# Patient Record
Sex: Male | Born: 1950 | Race: White | Hispanic: No | Marital: Married | Smoking: Former smoker
Health system: Southern US, Community
[De-identification: ages and names within clinical notes are randomized; demographics above are authoritative.]

## PROBLEM LIST (undated history)

## (undated) DIAGNOSIS — E7800 Pure hypercholesterolemia, unspecified: Secondary | ICD-10-CM

## (undated) HISTORY — PX: JOINT REPLACEMENT: SHX530

---

## 2016-03-20 ENCOUNTER — Emergency Department (HOSPITAL_COMMUNITY): Payer: Federal, State, Local not specified - PPO

## 2016-03-20 ENCOUNTER — Inpatient Hospital Stay (HOSPITAL_COMMUNITY): Payer: Federal, State, Local not specified - PPO

## 2016-03-20 ENCOUNTER — Inpatient Hospital Stay (HOSPITAL_COMMUNITY)
Admission: EM | Admit: 2016-03-20 | Discharge: 2016-03-23 | DRG: 024 | Disposition: A | Discharge status: Home / Self Care | Payer: Federal, State, Local not specified - PPO | Attending: Neurology | Admitting: Neurology

## 2016-03-20 ENCOUNTER — Inpatient Hospital Stay (HOSPITAL_COMMUNITY): Payer: Federal, State, Local not specified - PPO | Admitting: Certified Registered Nurse Anesthetist

## 2016-03-20 ENCOUNTER — Encounter (HOSPITAL_COMMUNITY): Payer: Self-pay | Admitting: Anesthesiology

## 2016-03-20 ENCOUNTER — Encounter (HOSPITAL_COMMUNITY)
Admission: EM | Disposition: A | Discharge status: Home / Self Care | Payer: Self-pay | Source: Home / Self Care | Attending: Neurology

## 2016-03-20 ENCOUNTER — Encounter (HOSPITAL_COMMUNITY): Payer: Self-pay | Admitting: Radiology

## 2016-03-20 DIAGNOSIS — Z87891 Personal history of nicotine dependence: Secondary | ICD-10-CM

## 2016-03-20 DIAGNOSIS — I63512 Cerebral infarction due to unspecified occlusion or stenosis of left middle cerebral artery: Secondary | ICD-10-CM | POA: Diagnosis present

## 2016-03-20 DIAGNOSIS — Q251 Coarctation of aorta: Secondary | ICD-10-CM

## 2016-03-20 DIAGNOSIS — I82542 Chronic embolism and thrombosis of left tibial vein: Secondary | ICD-10-CM | POA: Diagnosis present

## 2016-03-20 DIAGNOSIS — G8191 Hemiplegia, unspecified affecting right dominant side: Secondary | ICD-10-CM | POA: Diagnosis not present

## 2016-03-20 DIAGNOSIS — I82532 Chronic embolism and thrombosis of left popliteal vein: Secondary | ICD-10-CM | POA: Diagnosis present

## 2016-03-20 DIAGNOSIS — W19XXXA Unspecified fall, initial encounter: Secondary | ICD-10-CM

## 2016-03-20 DIAGNOSIS — R4702 Dysphasia: Secondary | ICD-10-CM

## 2016-03-20 DIAGNOSIS — I959 Hypotension, unspecified: Secondary | ICD-10-CM | POA: Diagnosis present

## 2016-03-20 DIAGNOSIS — Z9289 Personal history of other medical treatment: Secondary | ICD-10-CM

## 2016-03-20 DIAGNOSIS — E7800 Pure hypercholesterolemia, unspecified: Secondary | ICD-10-CM | POA: Diagnosis present

## 2016-03-20 DIAGNOSIS — Z79899 Other long term (current) drug therapy: Secondary | ICD-10-CM | POA: Diagnosis not present

## 2016-03-20 DIAGNOSIS — I63412 Cerebral infarction due to embolism of left middle cerebral artery: Secondary | ICD-10-CM | POA: Diagnosis not present

## 2016-03-20 DIAGNOSIS — Z96641 Presence of right artificial hip joint: Secondary | ICD-10-CM | POA: Diagnosis present

## 2016-03-20 DIAGNOSIS — R4701 Aphasia: Secondary | ICD-10-CM | POA: Diagnosis present

## 2016-03-20 DIAGNOSIS — I6789 Other cerebrovascular disease: Secondary | ICD-10-CM | POA: Diagnosis not present

## 2016-03-20 DIAGNOSIS — I82512 Chronic embolism and thrombosis of left femoral vein: Secondary | ICD-10-CM | POA: Diagnosis present

## 2016-03-20 DIAGNOSIS — I509 Heart failure, unspecified: Secondary | ICD-10-CM

## 2016-03-20 DIAGNOSIS — E876 Hypokalemia: Secondary | ICD-10-CM | POA: Diagnosis not present

## 2016-03-20 DIAGNOSIS — I639 Cerebral infarction, unspecified: Secondary | ICD-10-CM | POA: Diagnosis not present

## 2016-03-20 DIAGNOSIS — R269 Unspecified abnormalities of gait and mobility: Secondary | ICD-10-CM

## 2016-03-20 DIAGNOSIS — R29705 NIHSS score 5: Secondary | ICD-10-CM | POA: Diagnosis present

## 2016-03-20 DIAGNOSIS — Z01818 Encounter for other preprocedural examination: Secondary | ICD-10-CM

## 2016-03-20 HISTORY — PX: IR GENERIC HISTORICAL: IMG1180011

## 2016-03-20 HISTORY — DX: Pure hypercholesterolemia, unspecified: E78.00

## 2016-03-20 HISTORY — PX: RADIOLOGY WITH ANESTHESIA: SHX6223

## 2016-03-20 LAB — I-STAT CHEM 8, ED
BUN: 24 mg/dL — ABNORMAL HIGH (ref 6–20)
CHLORIDE: 104 mmol/L (ref 101–111)
Calcium, Ion: 1.1 mmol/L — ABNORMAL LOW (ref 1.15–1.40)
Creatinine, Ser: 0.9 mg/dL (ref 0.61–1.24)
Glucose, Bld: 101 mg/dL — ABNORMAL HIGH (ref 65–99)
HCT: 42 % (ref 39.0–52.0)
Hemoglobin: 14.3 g/dL (ref 13.0–17.0)
POTASSIUM: 3.9 mmol/L (ref 3.5–5.1)
SODIUM: 139 mmol/L (ref 135–145)
TCO2: 23 mmol/L (ref 0–100)

## 2016-03-20 LAB — COMPREHENSIVE METABOLIC PANEL
ALBUMIN: 4.1 g/dL (ref 3.5–5.0)
ALK PHOS: 63 U/L (ref 38–126)
ALT: 21 U/L (ref 17–63)
AST: 27 U/L (ref 15–41)
Anion gap: 10 (ref 5–15)
BUN: 19 mg/dL (ref 6–20)
CALCIUM: 9.2 mg/dL (ref 8.9–10.3)
CO2: 23 mmol/L (ref 22–32)
CREATININE: 0.98 mg/dL (ref 0.61–1.24)
Chloride: 104 mmol/L (ref 101–111)
GFR calc Af Amer: >60 mL/min (ref >60)
GFR calc non Af Amer: >60 mL/min (ref >60)
GLUCOSE: 104 mg/dL — AB (ref 65–99)
Potassium: 3.9 mmol/L (ref 3.5–5.1)
SODIUM: 137 mmol/L (ref 135–145)
Total Bilirubin: 0.4 mg/dL (ref 0.3–1.2)
Total Protein: 6.5 g/dL (ref 6.5–8.1)

## 2016-03-20 LAB — RAPID URINE DRUG SCREEN, HOSP PERFORMED
AMPHETAMINES: NOT DETECTED
Barbiturates: NOT DETECTED
Benzodiazepines: NOT DETECTED
COCAINE: NOT DETECTED
OPIATES: NOT DETECTED
TETRAHYDROCANNABINOL: NOT DETECTED

## 2016-03-20 LAB — APTT: aPTT: 27 seconds (ref 24–36)

## 2016-03-20 LAB — BLOOD GAS, ARTERIAL
ACID-BASE DEFICIT: 1.8 mmol/L (ref 0.0–2.0)
BICARBONATE: 22.3 mmol/L (ref 20.0–28.0)
Drawn by: 44898
FIO2: 60
LHR: 16 {breaths}/min
MECHVT: 580 mL
O2 Saturation: 99.6 %
PEEP/CPAP: 5 cmH2O
PO2 ART: 224 mmHg — AB (ref 83.0–108.0)
Patient temperature: 98.6
pCO2 arterial: 37.1 mmHg (ref 32.0–48.0)
pH, Arterial: 7.396 (ref 7.350–7.450)

## 2016-03-20 LAB — DIFFERENTIAL
Basophils Absolute: 0 10*3/uL (ref 0.0–0.1)
Basophils Relative: 0 %
Eosinophils Absolute: 0.3 10*3/uL (ref 0.0–0.7)
Eosinophils Relative: 4 %
LYMPHS PCT: 26 %
Lymphs Abs: 1.9 10*3/uL (ref 0.7–4.0)
MONO ABS: 0.6 10*3/uL (ref 0.1–1.0)
Monocytes Relative: 9 %
NEUTROS ABS: 4.4 10*3/uL (ref 1.7–7.7)
NEUTROS PCT: 61 %

## 2016-03-20 LAB — CBC
HCT: 43.1 % (ref 39.0–52.0)
HEMOGLOBIN: 14 g/dL (ref 13.0–17.0)
MCH: 30.9 pg (ref 26.0–34.0)
MCHC: 32.5 g/dL (ref 30.0–36.0)
MCV: 95.1 fL (ref 78.0–100.0)
PLATELETS: 208 10*3/uL (ref 150–400)
RBC: 4.53 MIL/uL (ref 4.22–5.81)
RDW: 12.7 % (ref 11.5–15.5)
WBC: 7.3 10*3/uL (ref 4.0–10.5)

## 2016-03-20 LAB — I-STAT TROPONIN, ED: Troponin i, poc: 0 ng/mL (ref 0.00–0.08)

## 2016-03-20 LAB — ETHANOL: Alcohol, Ethyl (B): <5 mg/dL (ref <5)

## 2016-03-20 LAB — PROTIME-INR
INR: 1.01
PROTHROMBIN TIME: 13.3 s (ref 11.4–15.2)

## 2016-03-20 LAB — MRSA PCR SCREENING: MRSA by PCR: NEGATIVE

## 2016-03-20 LAB — CBG MONITORING, ED: GLUCOSE-CAPILLARY: 98 mg/dL (ref 65–99)

## 2016-03-20 LAB — TRIGLYCERIDES: Triglycerides: 110 mg/dL (ref <150)

## 2016-03-20 SURGERY — RADIOLOGY WITH ANESTHESIA
Anesthesia: General

## 2016-03-20 MED ORDER — IOPAMIDOL (ISOVUE-370) INJECTION 76%
40.0000 mL | Freq: Once | INTRAVENOUS | Status: AC | PRN
  Administered 2016-03-20: 40 mL via INTRAVENOUS

## 2016-03-20 MED ORDER — IOPAMIDOL (ISOVUE-370) INJECTION 76%
INTRAVENOUS | Status: AC
  Filled 2016-03-20: qty 50

## 2016-03-20 MED ORDER — ACETAMINOPHEN 650 MG RE SUPP: 650.0000 mg | RECTAL | Status: DC | PRN

## 2016-03-20 MED ORDER — PROPOFOL 1000 MG/100ML IV EMUL
5.0000 ug/kg/min | INTRAVENOUS | Status: DC
  Administered 2016-03-20: 30 ug/kg/min via INTRAVENOUS

## 2016-03-20 MED ORDER — PROPOFOL 10 MG/ML IV BOLUS
INTRAVENOUS | Status: DC | PRN
  Administered 2016-03-20: 10 mg via INTRAVENOUS
  Administered 2016-03-20: 160 mg via INTRAVENOUS
  Administered 2016-03-20 (×2): 10 mg via INTRAVENOUS

## 2016-03-20 MED ORDER — SENNOSIDES-DOCUSATE SODIUM 8.6-50 MG PO TABS
1.0000 | ORAL_TABLET | Freq: Every evening | ORAL | Status: DC | PRN
  Filled 2016-03-20: qty 1

## 2016-03-20 MED ORDER — ALTEPLASE 30 MG/30 ML FOR INTERV. RAD
1.0000 mg | INTRA_ARTERIAL | Status: AC
  Filled 2016-03-20: qty 30

## 2016-03-20 MED ORDER — ACETAMINOPHEN 650 MG RE SUPP: 650.0000 mg | Freq: Four times a day (QID) | RECTAL | Status: DC | PRN

## 2016-03-20 MED ORDER — EPHEDRINE SULFATE-NACL 50-0.9 MG/10ML-% IV SOSY
PREFILLED_SYRINGE | INTRAVENOUS | Status: DC | PRN
  Administered 2016-03-20 (×2): 5 mg via INTRAVENOUS

## 2016-03-20 MED ORDER — SODIUM CHLORIDE 0.9 % IV SOLN
INTRAVENOUS | Status: DC | PRN
  Administered 2016-03-20: 07:00:00 via INTRAVENOUS

## 2016-03-20 MED ORDER — SUCCINYLCHOLINE CHLORIDE 200 MG/10ML IV SOSY
PREFILLED_SYRINGE | INTRAVENOUS | Status: DC | PRN
  Administered 2016-03-20: 120 mg via INTRAVENOUS

## 2016-03-20 MED ORDER — ROCURONIUM BROMIDE 10 MG/ML (PF) SYRINGE
PREFILLED_SYRINGE | INTRAVENOUS | Status: DC | PRN
  Administered 2016-03-20 (×2): 50 mg via INTRAVENOUS

## 2016-03-20 MED ORDER — SODIUM CHLORIDE 0.9 % IV SOLN
0.0000 mg/h | INTRAVENOUS | Status: DC
  Administered 2016-03-20: 5 mg/h via INTRAVENOUS
  Administered 2016-03-20: 2 mg/h via INTRAVENOUS
  Administered 2016-03-21: 5 mg/h via INTRAVENOUS
  Filled 2016-03-20 (×3): qty 10

## 2016-03-20 MED ORDER — NITROGLYCERIN 1 MG/10 ML FOR IR/CATH LAB
INTRA_ARTERIAL | Status: AC
  Filled 2016-03-20: qty 10

## 2016-03-20 MED ORDER — DEXTROSE 5 % IV SOLN
INTRAVENOUS | Status: DC | PRN
  Administered 2016-03-20: 20 ug/min via INTRAVENOUS

## 2016-03-20 MED ORDER — NICARDIPINE HCL IN NACL 20-0.86 MG/200ML-% IV SOLN
5.0000 mg/h | INTRAVENOUS | Status: DC
  Filled 2016-03-20: qty 800

## 2016-03-20 MED ORDER — ORAL CARE MOUTH RINSE
15.0000 mL | Freq: Four times a day (QID) | OROMUCOSAL | Status: DC
  Administered 2016-03-20 – 2016-03-21 (×4): 15 mL via OROMUCOSAL

## 2016-03-20 MED ORDER — CEFAZOLIN SODIUM-DEXTROSE 2-4 GM/100ML-% IV SOLN
INTRAVENOUS | Status: AC
  Filled 2016-03-20: qty 100

## 2016-03-20 MED ORDER — LABETALOL HCL 5 MG/ML IV SOLN: 10.0000 mg | INTRAVENOUS | Status: DC | PRN

## 2016-03-20 MED ORDER — FAMOTIDINE IN NACL 20-0.9 MG/50ML-% IV SOLN
20.0000 mg | Freq: Two times a day (BID) | INTRAVENOUS | Status: DC
  Administered 2016-03-20 – 2016-03-21 (×3): 20 mg via INTRAVENOUS
  Filled 2016-03-20 (×3): qty 50

## 2016-03-20 MED ORDER — ACETAMINOPHEN 500 MG PO TABS: 1000.0000 mg | ORAL_TABLET | Freq: Four times a day (QID) | ORAL | Status: DC | PRN

## 2016-03-20 MED ORDER — IOPAMIDOL (ISOVUE-370) INJECTION 76%
50.0000 mL | Freq: Once | INTRAVENOUS | Status: AC | PRN
  Administered 2016-03-20: 50 mL via INTRAVENOUS

## 2016-03-20 MED ORDER — CHLORHEXIDINE GLUCONATE 0.12% ORAL RINSE (MEDLINE KIT)
15.0000 mL | Freq: Two times a day (BID) | OROMUCOSAL | Status: DC
  Administered 2016-03-20 – 2016-03-21 (×3): 15 mL via OROMUCOSAL

## 2016-03-20 MED ORDER — LIDOCAINE 2% (20 MG/ML) 5 ML SYRINGE
INTRAMUSCULAR | Status: DC | PRN
  Administered 2016-03-20: 100 mg via INTRAVENOUS

## 2016-03-20 MED ORDER — FENTANYL CITRATE (PF) 100 MCG/2ML IJ SOLN
100.0000 ug | INTRAMUSCULAR | Status: DC | PRN
  Administered 2016-03-20 – 2016-03-21 (×2): 100 ug via INTRAVENOUS
  Filled 2016-03-20 (×3): qty 2

## 2016-03-20 MED ORDER — ACETAMINOPHEN 325 MG PO TABS: 650.0000 mg | ORAL_TABLET | ORAL | Status: DC | PRN

## 2016-03-20 MED ORDER — SODIUM CHLORIDE 0.9 % IV SOLN
INTRAVENOUS | Status: DC
  Administered 2016-03-20 – 2016-03-21 (×4): via INTRAVENOUS

## 2016-03-20 MED ORDER — MIDAZOLAM BOLUS VIA INFUSION
1.0000 mg | INTRAVENOUS | Status: DC | PRN
  Filled 2016-03-20: qty 2

## 2016-03-20 MED ORDER — CEFAZOLIN SODIUM-DEXTROSE 2-3 GM-% IV SOLR
INTRAVENOUS | Status: DC | PRN
  Administered 2016-03-20: 2 g via INTRAVENOUS

## 2016-03-20 MED ORDER — SODIUM CHLORIDE 0.9 % IV SOLN
INTRAVENOUS | Status: DC | PRN
  Administered 2016-03-20 (×2): via INTRAVENOUS

## 2016-03-20 MED ORDER — IOPAMIDOL (ISOVUE-300) INJECTION 61%
INTRAVENOUS | Status: AC
  Administered 2016-03-20: 125 mL
  Filled 2016-03-20: qty 300

## 2016-03-20 MED ORDER — PROPOFOL 500 MG/50ML IV EMUL
INTRAVENOUS | Status: DC | PRN
  Administered 2016-03-20: 75 ug/kg/min via INTRAVENOUS

## 2016-03-20 MED ORDER — SODIUM CHLORIDE 0.9 % IV SOLN
INTRAVENOUS | Status: DC
  Administered 2016-03-20: 10:00:00 via INTRAVENOUS

## 2016-03-20 MED ORDER — EPTIFIBATIDE 20 MG/10ML IV SOLN
INTRAVENOUS | Status: AC
  Filled 2016-03-20: qty 10

## 2016-03-20 MED ORDER — ONDANSETRON HCL 4 MG/2ML IJ SOLN: 4.0000 mg | Freq: Four times a day (QID) | INTRAMUSCULAR | Status: DC | PRN

## 2016-03-20 MED ORDER — FENTANYL CITRATE (PF) 100 MCG/2ML IJ SOLN
INTRAMUSCULAR | Status: DC | PRN
  Administered 2016-03-20 (×2): 50 ug via INTRAVENOUS
  Administered 2016-03-20: 100 ug via INTRAVENOUS

## 2016-03-20 MED ORDER — SODIUM CHLORIDE 0.9 % IJ SOLN
INTRAVENOUS | Status: AC | PRN
  Administered 2016-03-20 (×3): 25 ug via INTRA_ARTERIAL

## 2016-03-20 MED ORDER — PROPOFOL 1000 MG/100ML IV EMUL
INTRAVENOUS | Status: AC
  Filled 2016-03-20: qty 100

## 2016-03-20 MED ORDER — ALTEPLASE 30 MG/30 ML FOR INTERV. RAD
INTRA_ARTERIAL | Status: AC | PRN
  Administered 2016-03-20: 2 mg via INTRA_ARTERIAL
  Administered 2016-03-20: 3 mg via INTRA_ARTERIAL

## 2016-03-20 MED ORDER — PROPOFOL 1000 MG/100ML IV EMUL
0.0000 ug/kg/min | INTRAVENOUS | Status: DC
  Administered 2016-03-20: 40 ug/kg/min via INTRAVENOUS
  Filled 2016-03-20: qty 100

## 2016-03-20 MED ORDER — FENTANYL CITRATE (PF) 100 MCG/2ML IJ SOLN
100.0000 ug | INTRAMUSCULAR | Status: DC | PRN
  Administered 2016-03-20 – 2016-03-21 (×6): 100 ug via INTRAVENOUS
  Filled 2016-03-20 (×5): qty 2

## 2016-03-20 MED ORDER — HEPARIN SODIUM (PORCINE) 1000 UNIT/ML IJ SOLN
INTRAMUSCULAR | Status: AC
  Filled 2016-03-20: qty 1

## 2016-03-20 MED ORDER — ALTEPLASE (STROKE) FULL DOSE INFUSION
0.9000 mg/kg | INTRAVENOUS | Status: AC
  Administered 2016-03-20: 83 mg via INTRAVENOUS
  Filled 2016-03-20: qty 100

## 2016-03-20 MED ORDER — STROKE: EARLY STAGES OF RECOVERY BOOK
Freq: Once | Status: DC
  Filled 2016-03-20: qty 1

## 2016-03-20 NOTE — Progress Notes (Signed)
PCCM INTERVAL PROGRESS NOTE  Remains bradycardic despite weaning propofol  Plan: Transition to continuous midazolam for sedation.    Joneen RoachPaul Srihith Aquilino, AGACNP-BC [facility] Pulmonology/Critical Care Pager [phone] or [phone]  03/20/2016 3:09 PM

## 2016-03-20 NOTE — ED Triage Notes (Signed)
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 Per EMS, pt had right sided flaccidness, right facial droop, and aphasia.

## 2016-03-20 NOTE — ED Provider Notes (Addendum)
[facility]-EMERGENCY DEPT Provider Note   CSN: [id number] Arrival date & time: 03/20/16  4098   An emergency department physician performed an initial assessment on this suspected stroke patient at 0435.  History   Chief Complaint Chief Complaint  Patient presents with  . Code Stroke   Level V caveat: Acuity of condition HPI Jermaine Burns is a 65 y.o. male with no second past medical history presenting today with acute right-sided weakness. 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  He has an unknown last time seen normal. EMS noted that he has right-sided facial droop, ectasia, and right-sided weakness. There is no further history available on this patient. He cannot give his own history due to the acuity of his condition.  HPI  History reviewed. No pertinent past medical history.  There are no active problems to display for this patient.   No past surgical history on file.     Home Medications    Prior to Admission medications   Not on File    Family History No family history on file.  Social History Social History  Substance Use Topics  . Smoking status: Not on file  . Smokeless tobacco: Not on file  . Alcohol use Not on file     Allergies   Review of patient's allergies indicates not on file.   Review of Systems Review of Systems  Unable to perform ROS: Acuity of condition     Physical Exam Updated Vital Signs Pulse 71   Temp 97.9 F (36.6 C) (Oral)   Resp 16   Wt 203 lb 14.8 oz (92.5 kg)   SpO2 95%   Physical Exam  Constitutional: He is oriented to person, place, and time. Vital signs are normal. He appears well-developed and well-nourished.  Non-toxic appearance. He does not appear ill. No distress.  HENT:  Head: Normocephalic and atraumatic.  Nose: Nose normal.  Mouth/Throat: Oropharynx is clear and moist. No oropharyngeal exudate.  Eyes: Conjunctivae and EOM are normal. Pupils are equal, round, and reactive to  light. No scleral icterus.  Neck: Normal range of motion. Neck supple. No tracheal deviation, no edema, no erythema and normal range of motion present. No thyroid mass and no thyromegaly present.  Cardiovascular: Normal rate, regular rhythm, S1 normal, S2 normal, normal heart sounds, intact distal pulses and normal pulses.  Exam reveals no gallop and no friction rub.   No murmur heard. Pulmonary/Chest: Effort normal and breath sounds normal. No respiratory distress. He has no wheezes. He has no rhonchi. He has no rales.  Abdominal: Soft. Normal appearance and bowel sounds are normal. He exhibits no distension, no ascites and no mass. There is no hepatosplenomegaly. There is no tenderness. There is no rebound, no guarding and no CVA tenderness.  Musculoskeletal: Normal range of motion. He exhibits no edema or tenderness.  Lymphadenopathy:    He has no cervical adenopathy.  Neurological: He is alert and oriented to person, place, and time. He has normal strength. No cranial nerve deficit or sensory deficit. He exhibits normal muscle tone.  Aphasic speech. 0 out of 5 strength right upper and lower extremity. 5 out of 5 strength left upper and lower extremity. Patient follows commands. Normal cerebellar testing.  Skin: Skin is warm, dry and intact. No petechiae and no rash noted. He is not diaphoretic. No erythema. No pallor.  Nursing note and vitals reviewed.    ED Treatments / Results  Labs (all labs ordered are listed, but only  abnormal results are displayed) Labs Reviewed  COMPREHENSIVE METABOLIC PANEL - Abnormal; Notable for the following:       Result Value   Glucose, Bld 104 (*)    All other components within normal limits  I-STAT CHEM 8, ED - Abnormal; Notable for the following:    BUN 24 (*)    Glucose, Bld 101 (*)    Calcium, Ion 1.10 (*)    All other components within normal limits  PROTIME-INR  APTT  CBC  DIFFERENTIAL  RAPID URINE DRUG SCREEN, HOSP PERFORMED  ETHANOL   I-STAT TROPOININ, ED  CBG MONITORING, ED    EKG  EKG Interpretation  Date/Time:  Tuesday March 20 2016 05:01:53 EDT Ventricular Rate:  66 PR Interval:    QRS Duration: 109 QT Interval:  392 QTC Calculation: 411 R Axis:   72 Text Interpretation:  Normal sinus rhythm RSR prime No old tracing to compare Interpretation limited secondary to artifact Confirmed by Erroll LunaOni, [place] Thresher Ayokunle [id number]) on 03/20/2016 5:15:16 AM       Radiology Ct Cervical Spine Wo Contrast  Result Date: 03/20/2016 CLINICAL DATA:  65 y/o  M; unwitnessed fall. EXAM: CT CERVICAL SPINE WITHOUT CONTRAST TECHNIQUE: Multidetector CT imaging of the cervical spine was performed without intravenous contrast. Multiplanar CT image reconstructions were also generated. COMPARISON:  None. FINDINGS: Alignment: Normal cervical lordosis.  No listhesis. Skull base and vertebrae: No acute fracture. No primary bone lesion or focal pathologic process. Soft tissues and spinal canal: No prevertebral fluid or swelling. No visible canal hematoma. Disc levels: There is multilevel advanced cervical spondylosis with extensive discogenic changes and uncovertebral hypertrophy throughout the cervical spine there is multilevel canal stenosis that appears severe at the C4-5 and C5-6 levels. Additionally, there is multilevel moderate to severe bony foraminal narrowing. Upper chest: Negative. Other: No mass or inflammatory process is identified on this noncontrast examination. IMPRESSION: 1. No acute fracture or dislocation of the cervical spine. 2. Advanced cervical spondylosis with multilevel canal stenosis that appears severe at the C4 through C6 levels and mild-to-moderate foraminal narrowing. Electronically Signed   By: Mitzi HansenLance  Furusawa-Stratton M.D.   On: 03/20/2016 05:13   Ct Head Code Stroke W/o Cm  Result Date: 03/20/2016 CLINICAL DATA:  Code stroke. 65 y/o M; unwitnessed fall with dysphasia. EXAM: CT HEAD WITHOUT CONTRAST TECHNIQUE: Contiguous  axial images were obtained from the base of the skull through the vertex without intravenous contrast. COMPARISON:  None. FINDINGS: Brain: No evidence of acute infarction, hemorrhage, hydrocephalus, extra-axial collection or mass lesion/mass effect. Background of mild to moderate parenchymal volume loss and chronic microvascular ischemic changes. Vascular: Dense left M1 segment best appreciated on the coronal sequence (series 4, image 41). Skull: Normal. Negative for fracture or focal lesion. Sinuses/Orbits: No acute finding. Other: None. ASPECTS [facility]([place] Stroke Program Early CT Score) - Ganglionic level infarction (caudate, lentiform nuclei, internal capsule, insula, M1-M3 cortex): 7 - Supraganglionic infarction (M4-M6 cortex): 3 Total score (0-10 with 10 being normal): 10 IMPRESSION: 1. No acute intracranial abnormality is identified. 2. Dense left M1 may represent thrombus. 3. ASPECTS is 10 These results were called by telephone at the time of interpretation on 03/20/2016 at 5:07 am to Dr. Tomasita CrumbleADELEKE Sneha Willig , who verbally acknowledged these results. Electronically Signed   By: Mitzi HansenLance  Furusawa-Stratton M.D.   On: 03/20/2016 05:09    Procedures Procedures (including critical care time)  Medications Ordered in ED Medications  iopamidol (ISOVUE-370) 76 % injection (not administered)     Initial Impression / Assessment  and Plan / ED Course  I have reviewed the triage vital signs and the nursing notes.  Pertinent labs & imaging results that were available during my care of the patient were reviewed by me and considered in my medical decision making (see chart for details).  Clinical Course    Patient since emergency department for acute right-sided weakness concerning for stroke. Code stroke was called and patient immediately taken to CT scan.  Lab studies unremarkable.  5:26 AM CT scan does reveal a dense left M1 lesion. This corresponds well with the symptoms. Neurology has been at the bedside for  evaluation. Since there is an unknown time seen normal, there is no indication for TPA. Will admit the hospitalist for further management.  Upon repeat evaluation, patient's right-sided was moving more. He continues to be a phasic however.  Final Clinical Impressions(s) / ED Diagnoses   Final diagnoses:  Fall  Acute ischemic stroke [facility])  Acute ischemic stroke [facility])    New Prescriptions New Prescriptions   No medications on file     Tomasita Crumble, MD 03/20/16 0527   5:50 AM Dr. Roxy Manns is consulting with IR obtaining a perfusion study to see if the patient is a candidate. Will hold on hospitalist admission for now.   Tomasita Crumble, MD 03/20/16 [phone]

## 2016-03-20 NOTE — Progress Notes (Signed)
STROKE TEAM PROGRESS NOTE   HISTORY OF PRESENT ILLNESS (per record) This is a 65-yo man who presents via EMS. History is obtained directly from EMS initially as the patient is aphasic and unable to provide and no family was present.   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 They stated that there seemed to be some confusion about what happened as they were initially sent to the wrong floor. When they found the patient, they were told that he passed out and fell, after which he was noted to have weakness of the right side of his body. On their initial assessment, he was noted to have a flaccid right hemiparesis with aphasia. He was transported to the [facility] ED as a CODE STROKE. Dr. Shon Hale met him in the ED on his arrival, at which time he remained globally aphasic but was moving his right side well. NIHSS was 5. He was taken for an emergent head CT that showed possible hyperdensity in the M2 branch of the L MCA. CTA of the head and neck were then obtained and confirmed occlusion of the inferior division of the L M2 as well as occlusion of the distal L A2/proximal L A3. Initially, due to inability to confirm time last known well, he was not felt to be a safe candidate for IV tPA and Dr. Shon Hale contacted IR (Dr. Estanislado Pandy) to review to case with him and see if he may be a suitable candidate for thrombectomy.   After this discussion, CT perfusion was obtained and revealed a large area of penumbra in the L cerebral hemisphere. While arrangements were being made for the CT perfusion, his wife arrived. She was able to contact one of the patient's coworkers who stated that she last saw him normal at 0330 this morning 03/20/16 ([facility]KW). Based upon this report, IV tPA was then administered with a bolus of 8.3 mg given at 0615 followed by an infusion of 75 mg. Dr. Shon Hale spoke with Dr. Estanislado Pandy after the CT  perfusion was completed and he felt that the patient would likely benefit from thrombectomy and the IR team was mobilized. The wife was kept apprised to the treatment decisions (both tPA and IR/thrombectomy) and was agreeable to both.   His wife reports that he has a history of hypercholesterolemia but is otherwise health. He does not take daily aspirin. She reports that he takes several supplements but is not sure what they are.   He was taken to IR where he had complete revascularization of L MCA with Solitaire and IV tPA, also progressive recanalizing of L ACA A3.   He was admitted to the neuro ICU post IR for further evaluation and treatment.   SUBJECTIVE (INTERVAL HISTORY) No family at bedside. He is still intubated on versed. Still has groin sheath in. BP on the low side, will increase IVF.    OBJECTIVE Temp:  [97.4 F (36.3 C)-97.9 F (36.6 C)] 97.5 F (36.4 C) (10/10 0715) Pulse Rate:  [62-113] 74 (10/10 0715) Resp:  [10-19] 18 (10/10 0715) BP: (121-146)/(63-82) 146/81 (10/10 0715) SpO2:  [94 %-98 %] 94 % (10/10 0715) Weight:  [92.5 kg (203 lb 14.8 oz)] 92.5 kg (203 lb 14.8 oz) (10/10 0509)  CBC:  Recent Labs Lab 03/20/16 0437 03/20/16 0442  WBC 7.3  --   NEUTROABS 4.4  --   HGB 14.0 14.3  HCT 43.1 42.0  MCV 95.1  --   PLT 208  --     Basic Metabolic Panel:  Recent Labs Lab 03/20/16 0437 03/20/16 0442  NA 137 139  K 3.9 3.9  CL 104 104  CO2 23  --   GLUCOSE 104* 101*  BUN 19 24*  CREATININE 0.98 0.90  CALCIUM 9.2  --     Lipid Panel: No results found for: CHOL, TRIG, HDL, CHOLHDL, VLDL, LDLCALC HgbA1c: No results found for: HGBA1C Urine Drug Screen:    Component Value Date/Time   LABOPIA NONE DETECTED 03/20/2016 0501   COCAINSCRNUR NONE DETECTED 03/20/2016 0501   LABBENZ NONE DETECTED 03/20/2016 0501   AMPHETMU NONE DETECTED 03/20/2016 0501   THCU NONE DETECTED 03/20/2016 0501   LABBARB NONE DETECTED 03/20/2016 0501      IMAGING I have  personally reviewed the radiological images below and agree with the radiology interpretations.  Ct Head Code Stroke W/o Cm 03/20/2016 1. No acute intracranial abnormality is identified. 2. Dense left M1 may represent thrombus. 3. ASPECTS is 10   Ct Angio Head W Or Wo Contrast Ct Angio Neck W Or Wo Contrast 03/20/2016 1. Left M2 inferior division proximal occlusion. Poor collateralization of the posterior left MCA territory. 2. Left distal A2 to proximal A3 segment occlusion. 3. 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 4. Otherwise there is no proximal vessel occlusion, high-grade stenosis, or aneurysm of the circle of Willis. 5. Carotid and vertebral arteries of the neck are widely patent. 6. Advanced cervical spondylosis with severe canal stenosis at the C4-5 and C5-6 levels.   Ct Cervical Spine Wo Contrast 03/20/2016 1. No acute fracture or dislocation of the cervical spine. 2. Advanced cervical spondylosis with multilevel canal stenosis that appears severe at the C4 through C6 levels and mild-to-moderate foraminal narrowing.   Ct Cerebral Perfusion W Contrast 03/20/2016 1. Calculated core infarct volume: 29 mL, present in the left posterior MCA distribution. 2. Calculated ischemic penumbra volume: 146 mL, is present in the left MCA and left ACA distributions. 3. Calculated mismatch volume: 117 mL and Mismatch ratio 5.0. 4. No large subacute infarct on noncontrast head CT is present to complicate calculations.   Cerebral angio 03/20/16 1.S/P complete revascularization of occlude d LT MCA with x 2 passes with 4 mm x 40 mm Solitaire FR retriever device and 5 mg of superselective IV tpa achieving TICI 3reperfusion. 2. Progressive recanalizing Lt ACA A3 occlusion  MRI pending   PHYSICAL EXAM  Temp:  [97.4 F (36.3 C)-97.9 F (36.6 C)] 97.7 F (36.5 C) (10/10 1600) Pulse Rate:  [47-113] 49 (10/10 1730) Resp:  [10-27] 16 (10/10 1730) BP: (86-146)/(60-90) 100/61 (10/10 1730) SpO2:   [94 %-100 %] 100 % (10/10 1730) Arterial Line BP: (97-163)/(49-78) 117/50 (10/10 1730) FiO2 (%):  [40 %-60 %] 40 % (10/10 1559) Weight:  [203 lb 14.8 oz (92.5 kg)] 203 lb 14.8 oz (92.5 kg) (10/10 0509)  General - Well nourished, well developed, intubated on versed.  Cardiovascular - Regular rhythm, but bradycardia.  Neuro - intubated on versed, eyes closed, not following commands. Eyes middle position, pupils pinpoint bilaterally. Doll's eye present, corneal weak and positive gag. On pain stimulation, mild withdraw in all extremities. DTR 1+ and negative babinski. Gait, sensation and coordination not tested.    ASSESSMENT/PLAN Jermaine Burns is a 65 y.o. male with history of HLD and R hip fracture presenting following a fall with R hemiparesis. CTA confirmed L M2 and L A2/A3 occlusion. He  received IV t-PA 03/20/16 at 0615 and was taken to IR where he received TICI 3 reperfusion of L MCA and progressive recanalization of L A3.   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 Infarct felt to be embolic secondary to unknown disease source  Resultant  intubated  CTA head and neck L M2 occlusion. L A2/A3 occlusion. R V4 fusiform aneurysm.    CT perfusion L posterior MCA, L MCA and L ACA with salvageable penumbra. Mismatch vol 117  MRI  pending  2D Echo  pending  LDL pending   HgbA1c pending  SCDs for VTE prophylaxis  Diet NPO time specified  No antithrombotic prior to admission, now on No antithrombotic as within 24h of tPA. Await 24h imaging.  Ongoing aggressive stroke risk factor management  Therapy recommendations:  pending   Disposition:  pending   Respiratory failure  Intubated for neuro IR  CCM onboard  Hyperlipidemia  Home meds:  none  LDL pending, goal < 70  Add statin once po access  Continue statin at discharge  Other Stroke Risk  Factors  Former Cigarette smoker  Occasional ETOH use  Hospital day # 0  This patient is critically ill due to left MCA infarct s/p tPA and mechanical thrombectomy and at significant risk of neurological worsening, death form hemorrhagic transformation, recurrent stroke, shock, respiratory failure. This patient's care requires constant monitoring of vital signs, hemodynamics, respiratory and cardiac monitoring, review of multiple databases, neurological assessment, discussion with family, other specialists and medical decision making of high complexity. I spent 35 minutes of neurocritical care time in the care of this patient.  Rosalin Hawking, MD PhD Stroke Neurology 03/20/2016 6:05 PM

## 2016-03-20 NOTE — H&P (Addendum)
Neurology Admission H&P  Reason for Consultation: CODE STROKE  Requesting provider: Claudine Mouton MD  CC: Patient is aphasic and unable to provide any history  HPI: This is a 54-yo man who presents via EMS. History is obtained directly from EMS initially as the patient is aphasic and unable to provide and no family was present.   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 They stated that there seemed to be some confusion about what happened as they were initially sent to the wrong floor. When they found the patient, they were told that he passed out and fell, after which he was noted to have weakness of the right side of his body. On their initial assessment, he was noted to have a flaccid right hemiparesis with aphasia. He was transported to the [facility] ED as a CODE STROKE. I met him in the ED on his arrival, at which time he remained globally aphasic but was moving his right side well. NIHSS was 5. He was taken for an emergent head CT that showed possible hyperdensity in the M2 branch of the L MCA. CTA of the head and neck were then obtained and confirmed occlusion of the inferior division of the L M2 as well as occlusion of the distal L A2/proximal L A3. Initially, due to inability to confirm time last known well, he was not felt to be a safe candidate for IV tPA and I contacted IR (Dr. Estanislado Pandy) to review to case with him and see if he may be a suitable candidate for thrombectomy.   After this discussion, CT perfusion was obtained and revealed a large area of penumbra in the L cerebral hemisphere. While arrangements were being made for the CT perfusion, his wife arrived. She was able to contact one of the patient's coworkers who stated that she last saw him normal at 0330 this morning. Based upon this report, IV tPA was then administered with a bolus of 8.3 mg given at 0615 followed by an infusion of  75 mg. I spoke with Dr. Estanislado Pandy after the CT perfusion was completed and he felt that the patient would likely benefit from thrombectomy and the IR team was mobilized. The wife was kept apprised to the treatment decisions (both tPA and IR/thrombectomy) and was agreeable to both.   His wife reports that he has a history of hypercholesterolemia but is otherwise health. He does not take daily aspirin. She reports that he takes several supplements but is not sure what they are.   Last known well: 0330 am NIHSS score: 5 tPA given?: yes  PMH:  1. Hypercholesterolemia 2. H/o R hip fracture  PSH:  1. S/p R THA   Family history: None reported.   Social history: per wife/chart:  Social History   Social History  . Marital status: Married    Spouse name: N/A  . Number of children: N/A  . Years of education: N/A   Occupational History  . Not on file.   Social History Main Topics  . Smoking status: Former Smoker    Types: Cigarettes  . Smokeless tobacco: Never Used  . Alcohol use Yes     Comment: occassional  . Drug use: Unknown  . Sexual activity: Not on file   Other Topics Concern  . Not on file   Social History Narrative  . No narrative on file    Current outpatient meds:  Current Meds  Medication Sig  . Multiple Vitamin (MULTIVITAMIN WITH MINERALS) TABS tablet Take 1 tablet by mouth daily.    Current inpatient meds:  Current Facility-Administered Medications  Medication Dose Route Frequency Provider Last Rate Last Dose  .  stroke: mapping our early stages of recovery book   Does not apply Once Versie Starks, MD      . 0.9 %  sodium chloride infusion   Intravenous Continuous Versie Starks, MD 75 mL/hr at 03/20/16 0640    . acetaminophen (TYLENOL) tablet 650 mg  650 mg Oral Q4H PRN Versie Starks, MD       Or  . acetaminophen (TYLENOL) suppository 650 mg  650 mg Rectal Q4H PRN Versie Starks, MD      . famotidine (PEPCID) IVPB 20 mg premix  20  mg Intravenous Q12H Versie Starks, MD      . iopamidol (ISOVUE-370) 76 % injection           . iopamidol (ISOVUE-370) 76 % injection           . labetalol (NORMODYNE,TRANDATE) injection 10 mg  10 mg Intravenous Q10 min PRN Versie Starks, MD      . senna-docusate (Senokot-S) tablet 1 tablet  1 tablet Oral QHS PRN Versie Starks, MD       Current Outpatient Prescriptions  Medication Sig Dispense Refill  . Multiple Vitamin (MULTIVITAMIN WITH MINERALS) TABS tablet Take 1 tablet by mouth daily.      Allergies: No Known Allergies  ROS: As per HPI. A full 14-point review of systems could not be obtained as he is aphasic.  PE:  BP 146/81   Pulse 74   Temp 97.5 F (36.4 C) (Oral)   Resp 18   Wt 92.5 kg (203 lb 14.8 oz)   SpO2 94%   General: WDWN lying on ED gurney. He appears frustrated by his inability to communicate. He was some intact automatic speech (yes, no, I don't know, I'm OK) and at one point appeared to be reciting a rote prayer. However, he has a paucity of spontaneous verbal output and has a dense expressive aphasia. He is unable to follow commands and does not mimic. What little he does say is clear without significant dysarthria.  HEENT: Normocephalic. Neck supple without LAD. MMM, OP clear. Sclerae anicteric. No conjunctival injection.  CV: Regular, no murmur. Carotid pulses full and symmetric, no bruits. Distal pulses 2+ and symmetric.  Lungs: CTAB.  Abdomen: Soft, non-distended, non-tender. Bowel sounds present x4.  Extremities: No C/C/E. Neuro: Dense global aphasia limits exam.  CN: Pupils are equal and round. They are symmetrically reactive from 3-->2 mm. He appears to blink to visual threat from both sides. His has no apparent gaze abnormality. The R corneal seems relatively diminished. His face is symmetric at rest with normal strength and mobility. Hearing appears intact to conversational voice. Bilateral SCM appear 5/5. Tongue protrudes to midline.   Motor: Normal bulk and tone. He does not participate with confrontational strength testing but moves all four extremities symmetrically with at least 4/5 strength. No tremor or other abnormal movements. No drift.  Sensation: he grimaces and withdraws from mild noxious stimuli x4.  DTRs: 2+, brisker on the R. Toes downgoing on the L, upgoing on the R. Coordination: He does not participate with finger-to-nose or heel-to-shin due to his aphasia.  Gait: Deferred at this time due to aphasia.   Labs:  Lab Results  Component Value Date  WBC 7.3 03/20/2016   HGB 14.3 03/20/2016   HCT 42.0 03/20/2016   PLT 208 03/20/2016   GLUCOSE 101 (H) 03/20/2016   ALT 21 03/20/2016   AST 27 03/20/2016   NA 139 03/20/2016   K 3.9 03/20/2016   CL 104 03/20/2016   CREATININE 0.90 03/20/2016   BUN 24 (H) 03/20/2016   CO2 23 03/20/2016   INR 1.01 03/20/2016   Troponin-I 0.00 Urine drug screen negative Serum ethanol <5  Imaging:   I have personally and independently reviewed the [facility] without contrast from today. This shows a hyperdense L MCA without obvious acute ischemia. There is a mild burden of chronic small vessel ischemic change in the bihemispheric white matter.   I have personally and independently reviewed the CTA of the head from today. There is proximal occlusion of the inferior division of the L M2. Additional occlusion is noted in the L A2/A3 junction.  I have personally and independently reviewed the CTA of the neck from today. This shows no significant abnormality of the cervical vessels.   I have personally and independently reviewed the CT perfusion of the brain from today. This shows a large area of perfusion mismatch in the L hemisphere with a small core infarct surrounded by a large volume penumbra. Per radiologist calculations, core infarct volume is 29 mL and the penumbra volume is 146 mL.   Assessment and Plan:  1. Acute ischemic stroke: This is an acute stroke involving the L MCA  and L ACA territories. It is most likely embolic in etiology given this pattern. He was given IV tPA at about 2.75 hours after onset of symptoms. Given the large perfusion mismatch and obvious proximal occlusions on CTA, he was felt to be a good candidate for possible thrombectomy and was taken to the IR suite by Dr. Estanislado Pandy for intervention after tPA.  The only known risk factor for cerebrovascular disease in this patient is hypercholsterolemia. Additional workup will be ordered to include MRI brain, TTE, fasting lipids, and hemoglobin a1c. Further testing will be determined by results from these initial studies.   He will be admitted to the ICU post-tPA and post-IR. He should not receive any anticoagulation or antiplatelets for 24 hours post-tPA. He should not have any invasive procedures or vascular punctures are non-compressible sites for 24 hours post-tPA. His BP will be monitored closely, keeping SBP <185 and DBP <100. Recommend antiplatelet therapy with aspirin for secondary stroke prevention once cleared to take oral medications and once he is 24 hours out from tPA. He will need a statin with goal LDL less than 70. Ensure adequate glucose control. Avoid fever and hyperglycemia as these can extend the infarct. Avoid hypotonic IVF to minimize exacerbation of post-stroke edema. Initiate rehab services. DVT prophylaxis as needed.   2. Global aphasia: This is acute, due to stroke. This is dense. SLP to evaluate and treat.   3. Right hemiparesis: He initially had an acute flaccid right hemiparesis but this seems to have largely resolved at this time. PT/OT as needed.   This was discussed with the patient's wife at the bedside. She is in agreement with the plan as noted. She was given the opportunity to ask any questions and these were addressed to her satisfaction.   I discussed the case with the ED MD (Dr. Claudine Mouton), IR (Dr. Estanislado Pandy), neuroradiology (Dr. Jillyn Hidden), nursing, and pharmacy for  coordination of care at all steps along the way. Their assistance with this patient is greatly appreciated.  This patient is critically ill and at significant risk of neurological worsening, death and care requires constant monitoring of vital signs, hemodynamics,respiratory and cardiac monitoring, neurological assessment, discussion with family, other specialists and medical decision making of high complexity. A total of 182 minutes of critical care time was spent on this case.

## 2016-03-20 NOTE — Care Management Note (Signed)
Case Management Note  Patient Details  Name: Orma RenderRicky L Harper MRN: [id number] Date of Birth: 11/28/1950  Subjective/Objective:  Pt admitted on 03/20/16 with Lt MCA stroke.  PTA, pt independent, lives with spouse.  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                     Action/Plan: Pt currently remains intubated; will follow for discharge planning as pt progresses.    Expected Discharge Date:                  Expected Discharge Plan:  IP Rehab Facility  In-House Referral:     Discharge planning Services  CM Consult  Post Acute Care Choice:    Choice offered to:     DME Arranged:    DME Agency:     HH Arranged:    HH Agency:     Status of Service:  In process, will continue to follow  If discussed at Long Length of Stay Meetings, dates discussed:    Additional Comments:  Quintella BatonJulie W. Eliakim Tendler, RN, BSN  Trauma/Neuro ICU Case Manager [phone]

## 2016-03-20 NOTE — Transfer of Care (Signed)
Immediate Anesthesia Transfer of Care Note  Patient: Jermaine RenderRicky L Vanderploeg  Procedure(s) Performed: Procedure(s): RADIOLOGY WITH ANESTHESIA (N/A)  Patient Location: ICU  Anesthesia Type:General  Level of Consciousness: sedated, unresponsive and Patient remains intubated per anesthesia plan  Airway & Oxygen Therapy: Patient remains intubated per anesthesia plan and Patient placed on Ventilator (see vital sign flow sheet for setting)  Post-op Assessment: Report given to RN and Post -op Vital signs reviewed and stable  Post vital signs: Reviewed and stable  Last Vitals:  Vitals:   03/20/16 0715 03/20/16 0949  BP: 146/81 113/73  Pulse: 74 61  Resp: 18 14  Temp: 36.4 C     Last Pain:  Vitals:   03/20/16 0715  TempSrc: Oral         Complications: No apparent anesthesia complications

## 2016-03-20 NOTE — Procedures (Signed)
S/P $ vessel cerebral arteriogram . RT vCFA approach. Findings. 1.S/P complete revascularization of occlude d LT MCA with x 2 passes with 4 mm x 40 mm Solitaire FR retriever device and 5 mg of superselective IV tpa achieving TICI 3reperfusion. 2. Progressive recanalizing Lt ACA A3 occlusion

## 2016-03-20 NOTE — ED Notes (Signed)
Pt transported to IR with Patty, RN IR, and rapid response nurse

## 2016-03-20 NOTE — ED Notes (Signed)
C-collar removed by EDP.   

## 2016-03-20 NOTE — Anesthesia Procedure Notes (Signed)
Procedure Name: Intubation Date/Time: 03/20/2016 7:31 AM Performed by: Rise PatienceBELL, Inika Bellanger T Pre-anesthesia Checklist: Patient identified, Emergency Drugs available, Suction available and Patient being monitored Patient Re-evaluated:Patient Re-evaluated prior to inductionOxygen Delivery Method: Circle System Utilized Preoxygenation: Pre-oxygenation with 100% oxygen Intubation Type: IV induction and Rapid sequence Laryngoscope Size: Miller and 2 Grade View: Grade I Tube type: Subglottic suction tube Tube size: 7.5 mm Number of attempts: 1 Airway Equipment and Method: Stylet and Oral airway Placement Confirmation: ETT inserted through vocal cords under direct vision,  positive ETCO2 and breath sounds checked- equal and bilateral Secured at: 23 cm Tube secured with: Tape Dental Injury: Teeth and Oropharynx as per pre-operative assessment

## 2016-03-20 NOTE — Progress Notes (Signed)
RT note-Patient was transported from CT to [id number] on ventilator, report given to therapist in unit.

## 2016-03-20 NOTE — Progress Notes (Signed)
Code Stroke called on 65 y.o male. Found down at work, possible unwitnessed fall/syncope. Per wife at bedside Pt with history of recently diagnosed high cholesterol.  CT and labs completed STAT.  CT revealing dense left M1 possible thrombus, CTA ordered and completed STAT. Neurologist Dr. Roxy Mannsster at bedside. NIHSS completed yielding 5 for severe aphasia and  impaired LOC. LSN was initially unable to be determined. [facility] called per wife and staff member saw Patient last at [id number]. LSN 0330. TPA started at 0615, Pt received total dose 83 mg IV. Wife at bedside [phone], discussion had per Neurologist regarding IR. CT perfusion scan recommended per Dr. Ky Barbanevashware. CT perfusion completed and results discussed with Dr. Ky Barbanevashware.  IR team called in at  [phone] .

## 2016-03-20 NOTE — Plan of Care (Signed)
Problem: Skin Integrity: Goal: Risk for impaired skin integrity will decrease Outcome: Progressing Patient being turned q2h while sedated on ventilator

## 2016-03-20 NOTE — Sedation Documentation (Signed)
Pt brought to IR. Care handed off to anesthesia.

## 2016-03-20 NOTE — Sedation Documentation (Signed)
Right groin and pulses checked with Maralyn SagoSarah, RN.

## 2016-03-20 NOTE — Anesthesia Preprocedure Evaluation (Addendum)
Anesthesia Evaluation  Patient identified by MRN, date of birth, ID band Patient confused    Reviewed: Allergy & Precautions, NPO status , Patient's Chart, lab work & pertinent test results, Unable to perform ROS - Chart review onlyPreop documentation limited or incomplete due to emergent nature of procedure.  Airway Mallampati: II  TM Distance: >3 FB Neck ROM: Full    Dental no notable dental hx. (+) Teeth Intact, Dental Advisory Given   Pulmonary neg pulmonary ROS, former smoker,    Pulmonary exam normal breath sounds clear to auscultation       Cardiovascular negative cardio ROS Normal cardiovascular exam Rhythm:Regular Rate:Normal     Neuro/Psych CVA negative neurological ROS  negative psych ROS   GI/Hepatic negative GI ROS, Neg liver ROS,   Endo/Other  negative endocrine ROS  Renal/GU negative Renal ROS  negative genitourinary   Musculoskeletal negative musculoskeletal ROS (+)   Abdominal   Peds negative pediatric ROS (+)  Hematology negative hematology ROS (+)   Anesthesia Other Findings   Reproductive/Obstetrics negative OB ROS                            Anesthesia Physical Anesthesia Plan  ASA: I and emergent  Anesthesia Plan: General   Post-op Pain Management:    Induction: Intravenous, Rapid sequence and Cricoid pressure planned  Airway Management Planned: Oral ETT  Additional Equipment:   Intra-op Plan:   Post-operative Plan: Post-operative intubation/ventilation  Informed Consent: I have reviewed the patients History and Physical, chart, labs and discussed the procedure including the risks, benefits and alternatives for the proposed anesthesia with the patient or authorized representative who has indicated his/her understanding and acceptance.   Dental advisory given, History available from chart only and Only emergency history available  Plan Discussed with:  CRNA  Anesthesia Plan Comments:         Anesthesia Quick Evaluation

## 2016-03-20 NOTE — Consult Note (Signed)
PULMONARY / CRITICAL CARE MEDICINE   Name: Jermaine Burns MRN: [id number] DOB: Sep 19, 1950    ADMISSION DATE:  03/20/2016 CONSULTATION DATE:  03/20/2016  REFERRING MD:  Dr. 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 Upon EMS arrival the patient was globally aphasic and was unable to describe the events of his fall, however, they did note that he was having some flaccid right hemiparesis and they emergently transported to the emergency department to [facility] as a code stroke. Upon arrival to the emergency department he was able to have movement of his right side however he did remain aphasic. Emergent head CT showed possible acute stroke. Initially there were some questions about timeline and last known well time. However, this was clarified and he was given IV TPA. CT perfusion scan done showed a large amount of penumbra so he was considered, and taken to interventional radiology under Dr. Corliss Skains where complete revascularization of occluded left MCA and left ACA was achieved. After the procedure he was sent to the ICU intubated for recovery. PCCM asked to see.  PAST MEDICAL HISTORY :  He  has a past medical history of Hypercholesterolemia.  PAST SURGICAL HISTORY: He  has a past surgical history that includes Joint replacement.  No Known Allergies  No current facility-administered medications on file prior to encounter.    No current outpatient prescriptions on file prior to encounter.    FAMILY HISTORY:  His has no family status information on file.    SOCIAL HISTORY: He  reports that he has quit smoking. His smoking use included Cigarettes. He has never used smokeless tobacco. He reports  that he drinks alcohol.  REVIEW OF SYSTEMS:   Unable as patient is sedated and intubated.  SUBJECTIVE:    VITAL SIGNS: BP 95/63   Pulse (!) 59   Temp 97.5 F (36.4 C) (Oral)   Resp 14   Ht 5\' 10"  (1.778 m)   Wt 92.5 kg (203 lb 14.8 oz)   SpO2 99%   BMI 29.26 kg/m   HEMODYNAMICS:    VENTILATOR SETTINGS: Vent Mode: PRVC FiO2 (%):  [60 %] 60 % Set Rate:  [14 bmp] 14 bmp Vt Set:  [580 mL] 580 mL PEEP:  [5 cmH20] 5 cmH20 Plateau Pressure:  [15 cmH20] 15 cmH20  INTAKE / OUTPUT: No intake/output data recorded.  PHYSICAL EXAMINATION: General:  Male of normal body habitus in NAD Neuro:  Sedated on vent.  HEENT:  /AT, ETT Cardiovascular:  RRR, no MRG Lungs:  clear Abdomen:  Soft, non-distended. + BS Musculoskeletal:  No acute deformity  Skin:  Grossly intact  LABS:  BMET  Recent Labs Lab 03/20/16 0437 03/20/16 0442  NA 137 139  K 3.9 3.9  CL 104 104  CO2 23  --   BUN 19 24*  CREATININE 0.98 0.90  GLUCOSE 104* 101*    Electrolytes  Recent Labs Lab 03/20/16 0437  CALCIUM 9.2    CBC  Recent Labs Lab 03/20/16 0437 03/20/16 0442  WBC 7.3  --   HGB 14.0 14.3  HCT 43.1 42.0  PLT 208  --     Coag's  Recent Labs Lab 03/20/16 0437  APTT 27  INR 1.01  Sepsis Markers No results for input(s): LATICACIDVEN, PROCALCITON, O2SATVEN in the last 168 hours.  ABG No results for input(s): PHART, PCO2ART, PO2ART in the last 168 hours.  Liver Enzymes  Recent Labs Lab 03/20/16 0437  AST 27  ALT 21  ALKPHOS 63  BILITOT 0.4  ALBUMIN 4.1    Cardiac Enzymes No results for input(s): TROPONINI, PROBNP in the last 168 hours.  Glucose  Recent Labs Lab 03/20/16 0532  GLUCAP 98    Imaging Ct Angio Head W Or Wo Contrast  Result Date: 03/20/2016 CLINICAL DATA:  65 y/o M; difficulty speaking and right-sided weakness. EXAM: CT ANGIOGRAPHY HEAD AND NECK TECHNIQUE: Multidetector CT imaging of the head and neck was performed using the  standard protocol during bolus administration of intravenous contrast. Multiplanar CT image reconstructions and MIPs were obtained to evaluate the vascular anatomy. Carotid stenosis measurements (when applicable) are obtained utilizing NASCET criteria, using the distal internal carotid diameter as the denominator. CONTRAST:  50 cc Isovue 370 COMPARISON:  03/20/2016 CT head. FINDINGS: CTA NECK FINDINGS Aortic arch: 3 vessel arch. No significant stenosis of great vessel origins. Mild atherosclerosis with calcifications. Right carotid system: No evidence of dissection, stenosis (50% or greater) or occlusion. Mild calcified plaque at the bifurcation. Left carotid system: No evidence of dissection, stenosis (50% or greater) or occlusion. Mild calcified plaque at the bifurcation. Vertebral arteries: Codominant. No evidence of dissection, stenosis (50% or greater) or occlusion. Skeleton: Multilevel advanced cervical spondylosis with extensive discogenic degenerative changes and uncovertebral hypertrophy. There is canal stenosis throughout the cervical spine severe at the C4-5 and C5-6 levels. Additionally, there is multilevel moderate to severe bony foraminal narrowing. Other neck: No mass or inflammatory process is identified. Upper chest: Negative. Review of the MIP images confirms the above findings CTA HEAD FINDINGS Anterior circulation: Occlusion of left A2 segment just downstream to frontal polar artery origin with collateral reconstitution at the proximal A3 segment (series 406, image 15). Left M2 inferior division proximal occlusion. There is poor collateralization in the left posterior MCA territory (series 401, image 144). Otherwise there is no proximal vessel occlusion, high-grade stenosis, or aneurysm of the anterior circulation. Posterior circulation: Right V4 focal fusiform aneurysm measuring 5 mm (series 401, image 101). Otherwise there is no high-grade stenosis, aneurysm, or occlusion of the posterior  circulation. Venous sinuses: Poor contrast opacification. Anatomic variants: Diminutive left posterior communicating artery. No right posterior communicating artery identified, likely hypoplastic or absent. Small anterior communicating artery Review of the MIP images confirms the above findings IMPRESSION: 1. Left M2 inferior division proximal occlusion. Poor collateralization of the posterior left MCA territory. 2. Left distal A2 to proximal A3 segment occlusion. 3. 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 4. Otherwise there is no proximal vessel occlusion, high-grade stenosis, or aneurysm of the circle of Willis. 5. Carotid and vertebral arteries of the neck are widely patent. 6. Advanced cervical spondylosis with severe canal stenosis at the C4-5 and C5-6 levels. These results were called by telephone at the time of interpretation on 03/20/2016 at 5:39 am to Dr. Rhona LeavensIMOTHY OSTER , who verbally acknowledged these results. Electronically Signed   By: Mitzi HansenLance  Furusawa-Stratton M.D.   On: 03/20/2016 05:50   Ct Head Wo Contrast  Result Date: 03/20/2016 CLINICAL DATA:  Left MCA and ACA thrombus. Status post endovascular therapy. EXAM: CT HEAD WITHOUT CONTRAST TECHNIQUE: Contiguous axial images were obtained from the base of the skull through the vertex without intravenous contrast. COMPARISON:  Earlier today, most recently  at 4:50 a.m. FINDINGS: Brain: Gray-white blurring in the left parietal region, correlating with infarct core seen on previous cerebral perfusion. Infarct is also seen in the inferior posterior left insula. No hemorrhagic conversion. No shift or hydrocephalus. Vascular: Symmetric vessel density. Skull: Negative Sinuses/Orbits: Negative IMPRESSION: 1. Acute infarct now visible in the left parietal and posterior insular regions, correlating with infarct core on prior CT perfusion. 2. No hemorrhagic conversion. Electronically Signed   By: Marnee Spring M.D.   On: 03/20/2016 09:50   Ct Angio Neck  W Or Wo Contrast  Result Date: 03/20/2016 CLINICAL DATA:  65 y/o M; difficulty speaking and right-sided weakness. EXAM: CT ANGIOGRAPHY HEAD AND NECK TECHNIQUE: Multidetector CT imaging of the head and neck was performed using the standard protocol during bolus administration of intravenous contrast. Multiplanar CT image reconstructions and MIPs were obtained to evaluate the vascular anatomy. Carotid stenosis measurements (when applicable) are obtained utilizing NASCET criteria, using the distal internal carotid diameter as the denominator. CONTRAST:  50 cc Isovue 370 COMPARISON:  03/20/2016 CT head. FINDINGS: CTA NECK FINDINGS Aortic arch: 3 vessel arch. No significant stenosis of great vessel origins. Mild atherosclerosis with calcifications. Right carotid system: No evidence of dissection, stenosis (50% or greater) or occlusion. Mild calcified plaque at the bifurcation. Left carotid system: No evidence of dissection, stenosis (50% or greater) or occlusion. Mild calcified plaque at the bifurcation. Vertebral arteries: Codominant. No evidence of dissection, stenosis (50% or greater) or occlusion. Skeleton: Multilevel advanced cervical spondylosis with extensive discogenic degenerative changes and uncovertebral hypertrophy. There is canal stenosis throughout the cervical spine severe at the C4-5 and C5-6 levels. Additionally, there is multilevel moderate to severe bony foraminal narrowing. Other neck: No mass or inflammatory process is identified. Upper chest: Negative. Review of the MIP images confirms the above findings CTA HEAD FINDINGS Anterior circulation: Occlusion of left A2 segment just downstream to frontal polar artery origin with collateral reconstitution at the proximal A3 segment (series 406, image 15). Left M2 inferior division proximal occlusion. There is poor collateralization in the left posterior MCA territory (series 401, image 144). Otherwise there is no proximal vessel occlusion, high-grade  stenosis, or aneurysm of the anterior circulation. Posterior circulation: Right V4 focal fusiform aneurysm measuring 5 mm (series 401, image 101). Otherwise there is no high-grade stenosis, aneurysm, or occlusion of the posterior circulation. Venous sinuses: Poor contrast opacification. Anatomic variants: Diminutive left posterior communicating artery. No right posterior communicating artery identified, likely hypoplastic or absent. Small anterior communicating artery Review of the MIP images confirms the above findings IMPRESSION: 1. Left M2 inferior division proximal occlusion. Poor collateralization of the posterior left MCA territory. 2. Left distal A2 to proximal A3 segment occlusion. 3. 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 4. Otherwise there is no proximal vessel occlusion, high-grade stenosis, or aneurysm of the circle of Willis. 5. Carotid and vertebral arteries of the neck are widely patent. 6. Advanced cervical spondylosis with severe canal stenosis at the C4-5 and C5-6 levels. These results were called by telephone at the time of interpretation on 03/20/2016 at 5:39 am to Dr. Rhona Leavens , who verbally acknowledged these results. Electronically Signed   By: Mitzi Hansen M.D.   On: 03/20/2016 05:50   Ct Cervical Spine Wo Contrast  Result Date: 03/20/2016 CLINICAL DATA:  65 y/o  M; unwitnessed fall. EXAM: CT CERVICAL SPINE WITHOUT CONTRAST TECHNIQUE: Multidetector CT imaging of the cervical spine was performed without intravenous contrast. Multiplanar CT image reconstructions were also generated.  COMPARISON:  None. FINDINGS: Alignment: Normal cervical lordosis.  No listhesis. Skull base and vertebrae: No acute fracture. No primary bone lesion or focal pathologic process. Soft tissues and spinal canal: No prevertebral fluid or swelling. No visible canal hematoma. Disc levels: There is multilevel advanced cervical spondylosis with extensive discogenic changes and uncovertebral  hypertrophy throughout the cervical spine there is multilevel canal stenosis that appears severe at the C4-5 and C5-6 levels. Additionally, there is multilevel moderate to severe bony foraminal narrowing. Upper chest: Negative. Other: No mass or inflammatory process is identified on this noncontrast examination. IMPRESSION: 1. No acute fracture or dislocation of the cervical spine. 2. Advanced cervical spondylosis with multilevel canal stenosis that appears severe at the C4 through C6 levels and mild-to-moderate foraminal narrowing. Electronically Signed   By: Mitzi Hansen M.D.   On: 03/20/2016 05:13   Ct Cerebral Perfusion W Contrast  Result Date: 03/20/2016 CLINICAL DATA:  65 y/o  M; right-sided weakness. EXAM: CT CEREBRAL PERFUSION WITH CONTRAST TECHNIQUE: CT was perfusion was performed with automatic post processing by RAPID software. CONTRAST:  40 cc Isovue 370 COMPARISON:  CT angiogram head and neck 03/20/2016 per FINDINGS: Good quality arterial input and venous outflow functions. Mild patient motion. CBF less than 30% volume:  29 mL T-max greater than 6 seconds volume: 146 mL Mismatch volume: 117 mL Mismatch ratio 5.0 Cord ischemia is present in the left posterior MCA distribution. Ischemic penumbra is present in both the left ACA and left MCA distributions. No large subacute infarct on noncontrast head CT. IMPRESSION: 1. Calculated core infarct volume: 29 mL, present in the left posterior MCA distribution. 2. Calculated ischemic penumbra volume: 146 mL, is present in the left MCA and left ACA distributions. 3. Calculated mismatch volume: 117 mL and Mismatch ratio 5.0. 4. No large subacute infarct on noncontrast head CT is present to complicate calculations. Electronically Signed   By: Mitzi Hansen M.D.   On: 03/20/2016 06:43   Ct Head Code Stroke W/o Cm  Result Date: 03/20/2016 CLINICAL DATA:  Code stroke. 65 y/o M; unwitnessed fall with dysphasia. EXAM: CT HEAD WITHOUT  CONTRAST TECHNIQUE: Contiguous axial images were obtained from the base of the skull through the vertex without intravenous contrast. COMPARISON:  None. FINDINGS: Brain: No evidence of acute infarction, hemorrhage, hydrocephalus, extra-axial collection or mass lesion/mass effect. Background of mild to moderate parenchymal volume loss and chronic microvascular ischemic changes. Vascular: Dense left M1 segment best appreciated on the coronal sequence (series 4, image 41). Skull: Normal. Negative for fracture or focal lesion. Sinuses/Orbits: No acute finding. Other: None. ASPECTS [facility] Stroke Program Early CT Score) - Ganglionic level infarction (caudate, lentiform nuclei, internal capsule, insula, M1-M3 cortex): 7 - Supraganglionic infarction (M4-M6 cortex): 3 Total score (0-10 with 10 being normal): 10 IMPRESSION: 1. No acute intracranial abnormality is identified. 2. Dense left M1 may represent thrombus. 3. ASPECTS is 10 These results were called by telephone at the time of interpretation on 03/20/2016 at 5:07 am to Dr. Tomasita Crumble , who verbally acknowledged these results. Electronically Signed   By: Mitzi Hansen M.D.   On: 03/20/2016 05:09     STUDIES:  -CT head 10/10 > Dense left M1 may represent thrombus. -CT angio/perfusion 10/10 > Left M2 inferior division proximal occlusion. Poor collateralization of the posterior left MCA territory. Left distal A2 to proximal A3 segment occlusion. 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 Otherwise there is no proximal vessel occlusion, high-grade stenosis, or aneurysm of the circle  of Willis. Carotid and vertebral arteries of the neck are widely patent. Advanced cervical spondylosis with severe canal stenosis at the C4-5 and C5-6 levels.  CULTURES: NA  ANTIBIOTICS: NA  SIGNIFICANT EVENTS: 10/10 acute CVA, IV TPA given, to IR for revascularization  LINES/TUBES: ETT 10/10 > Fem sheath 10/10 >  DISCUSSION: 65 year old male suffered acute  L MCA and ACA infarct. He was given IV TPA in the emergency room and then went to IR for revascularization. He is then sent to ICU for recovery on ventilator.  ASSESSMENT / PLAN:  PULMONARY A: Inability to protect airway in setting acute stroke and medical sedation.  P:   Full vent support Can likely extubate 10/11 CXR for ETT ABG VAP  CARDIOVASCULAR A:  Hx hyperlipidemia  P:  Telemetry SBP goal per Dr. Corliss Skains (120-170mmHg) Nicardipine infusion and labetalol PRN for SBP goal Statin for HLD and CVA  RENAL A:   No acute issues  P:   Repeat BMP in AM  GASTROINTESTINAL A:   At risk dysphagia  P:   SLP eval once extubated Pepcid for SUP NPO for now  HEMATOLOGIC A:   No acute issues  P:  Repeat CBC in AM  INFECTIOUS A:   No acute issues  P:   Follow WBC and fever curves  ENDOCRINE A:   No acute issues    P:   Monitor glucose on BMP  NEUROLOGIC A:   Acute L MCA and ACA infarct (s/p IV tpa and IR revascularization 10/10)  P:   RASS goal: -1 to -2 Propofol for sedation Management per stoke team with plans for echo, carotid dopplers, MRI, hgb a1c Frequent neuro checks   FAMILY  - Updates: Wife and other family members updated bedside  - Inter-disciplinary family meet or Palliative Care meeting due by:  10/17   Joneen Roach, AGACNP-BC [place] [facility] Pager [phone] or [phone]  03/20/2016 10:41 AM  Attending note: I have seen and examined the patient with nurse practitioner/resident and agree with the note. History, labs and imaging reviewed.  65 Y/O with PMH of hypercholesterolemia. Admitted with acute LT CA stroke. S/p TPA and IR revascularization. Will keep on vent for today. Assess mental status in AM. Check ABG, CXR  The patient is critically ill with multiple organ systems failure and requires high complexity decision making for assessment and support, frequent evaluation and titration of therapies,  application of advanced monitoring technologies and extensive interpretation of multiple databases. Critical Care Time devoted to patient care services described in this note independent of APP time is 45 minutes. Critical care time - 35 mins. This represents my time independent of the NPs time taking care of the pt.  Chilton Greathouse MD [place] [facility] Pager [phone] If no answer or after 3pm call: [phone] 03/20/2016, 12:48 PM

## 2016-03-21 ENCOUNTER — Inpatient Hospital Stay (HOSPITAL_COMMUNITY): Payer: Federal, State, Local not specified - PPO

## 2016-03-21 ENCOUNTER — Encounter (HOSPITAL_COMMUNITY): Payer: Self-pay | Admitting: Interventional Radiology

## 2016-03-21 DIAGNOSIS — I6789 Other cerebrovascular disease: Secondary | ICD-10-CM

## 2016-03-21 DIAGNOSIS — E7800 Pure hypercholesterolemia, unspecified: Secondary | ICD-10-CM

## 2016-03-21 LAB — LIPID PANEL
CHOL/HDL RATIO: 4.4 ratio
CHOLESTEROL: 140 mg/dL (ref 0–200)
HDL: 32 mg/dL — AB (ref >40)
LDL Cholesterol: 90 mg/dL (ref 0–99)
Triglycerides: 91 mg/dL (ref <150)
VLDL: 18 mg/dL (ref 0–40)

## 2016-03-21 LAB — CBC WITH DIFFERENTIAL/PLATELET
Basophils Absolute: 0 10*3/uL (ref 0.0–0.1)
Basophils Relative: 0 %
EOS ABS: 0.1 10*3/uL (ref 0.0–0.7)
Eosinophils Relative: 2 %
HCT: 32.9 % — ABNORMAL LOW (ref 39.0–52.0)
HEMOGLOBIN: 11 g/dL — AB (ref 13.0–17.0)
LYMPHS ABS: 0.6 10*3/uL — AB (ref 0.7–4.0)
LYMPHS PCT: 12 %
MCH: 31.2 pg (ref 26.0–34.0)
MCHC: 33.4 g/dL (ref 30.0–36.0)
MCV: 93.2 fL (ref 78.0–100.0)
Monocytes Absolute: 0.5 10*3/uL (ref 0.1–1.0)
Monocytes Relative: 9 %
NEUTROS PCT: 77 %
Neutro Abs: 4.1 10*3/uL (ref 1.7–7.7)
Platelets: 139 10*3/uL — ABNORMAL LOW (ref 150–400)
RBC: 3.53 MIL/uL — AB (ref 4.22–5.81)
RDW: 13.1 % (ref 11.5–15.5)
WBC: 5.3 10*3/uL (ref 4.0–10.5)

## 2016-03-21 LAB — BASIC METABOLIC PANEL
ANION GAP: 6 (ref 5–15)
BUN: 8 mg/dL (ref 6–20)
CHLORIDE: 110 mmol/L (ref 101–111)
CO2: 22 mmol/L (ref 22–32)
Calcium: 8 mg/dL — ABNORMAL LOW (ref 8.9–10.3)
Creatinine, Ser: 0.76 mg/dL (ref 0.61–1.24)
GFR calc Af Amer: >60 mL/min (ref >60)
GFR calc non Af Amer: >60 mL/min (ref >60)
Glucose, Bld: 99 mg/dL (ref 65–99)
POTASSIUM: 3.3 mmol/L — AB (ref 3.5–5.1)
SODIUM: 138 mmol/L (ref 135–145)

## 2016-03-21 LAB — ECHOCARDIOGRAM COMPLETE
HEIGHTINCHES: 70 in
WEIGHTICAEL: 3262.81 [oz_av]

## 2016-03-21 LAB — GLUCOSE, CAPILLARY
GLUCOSE-CAPILLARY: 153 mg/dL — AB (ref 65–99)
GLUCOSE-CAPILLARY: 119 mg/dL — AB (ref 65–99)

## 2016-03-21 MED ORDER — SODIUM CHLORIDE 0.9 % IV BOLUS (SEPSIS)
1000.0000 mL | Freq: Once | INTRAVENOUS | Status: AC
  Administered 2016-03-21: 1000 mL via INTRAVENOUS

## 2016-03-21 MED ORDER — INSULIN ASPART 100 UNIT/ML ~~LOC~~ SOLN
0.0000 [IU] | Freq: Three times a day (TID) | SUBCUTANEOUS | Status: DC
  Administered 2016-03-21: 3 [IU] via SUBCUTANEOUS
  Administered 2016-03-22: 2 [IU] via SUBCUTANEOUS

## 2016-03-21 MED ORDER — ASPIRIN 300 MG RE SUPP
300.0000 mg | Freq: Every day | RECTAL | Status: DC
Start: 2016-03-21 — End: 2016-03-21

## 2016-03-21 MED ORDER — ASPIRIN EC 325 MG PO TBEC
325.0000 mg | DELAYED_RELEASE_TABLET | Freq: Every day | ORAL | Status: DC
  Administered 2016-03-21 – 2016-03-23 (×3): 325 mg via ORAL
  Filled 2016-03-21 (×3): qty 1

## 2016-03-21 MED ORDER — ENOXAPARIN SODIUM 40 MG/0.4ML ~~LOC~~ SOLN
40.0000 mg | SUBCUTANEOUS | Status: DC
  Administered 2016-03-21 – 2016-03-23 (×3): 40 mg via SUBCUTANEOUS
  Filled 2016-03-21 (×3): qty 0.4

## 2016-03-21 MED ORDER — PANTOPRAZOLE SODIUM 40 MG PO TBEC
40.0000 mg | DELAYED_RELEASE_TABLET | Freq: Every day | ORAL | Status: DC
  Administered 2016-03-22 – 2016-03-23 (×2): 40 mg via ORAL
  Filled 2016-03-21 (×2): qty 1

## 2016-03-21 MED ORDER — LABETALOL HCL 5 MG/ML IV SOLN: 10.0000 mg | INTRAVENOUS | Status: DC | PRN

## 2016-03-21 MED ORDER — CHLORHEXIDINE GLUCONATE 0.12 % MT SOLN
15.0000 mL | Freq: Two times a day (BID) | OROMUCOSAL | Status: DC
  Administered 2016-03-22: 15 mL via OROMUCOSAL
  Filled 2016-03-21: qty 15

## 2016-03-21 MED ORDER — ATORVASTATIN CALCIUM 10 MG PO TABS
20.0000 mg | ORAL_TABLET | Freq: Every day | ORAL | Status: DC
  Administered 2016-03-21 – 2016-03-22 (×2): 20 mg via ORAL
  Filled 2016-03-21: qty 2
  Filled 2016-03-21: qty 1

## 2016-03-21 MED ORDER — ORAL CARE MOUTH RINSE
15.0000 mL | Freq: Two times a day (BID) | OROMUCOSAL | Status: DC
  Administered 2016-03-21: 15 mL via OROMUCOSAL

## 2016-03-21 NOTE — Progress Notes (Signed)
  Echocardiogram 2D Echocardiogram has been performed.  Jermaine Burns, Jermaine Burns 03/21/2016, 2:48 PM

## 2016-03-21 NOTE — Anesthesia Postprocedure Evaluation (Signed)
Anesthesia Post Note  Patient: Orma RenderRicky L Loren  Procedure(s) Performed: Procedure(s) (LRB): RADIOLOGY WITH ANESTHESIA (N/A)  Patient location during evaluation: ICU Anesthesia Type: General Level of consciousness: sedated Pain management: pain level controlled Vital Signs Assessment: post-procedure vital signs reviewed and stable Respiratory status: spontaneous breathing, nonlabored ventilation, respiratory function stable and patient on ventilator - see flowsheet for VS Cardiovascular status: blood pressure returned to baseline and stable Anesthetic complications: no     Last Vitals:  Vitals:   03/21/16 1700 03/21/16 1800  BP: 118/68 119/65  Pulse: 88 95  Resp: 18 (!) 23  Temp:      Last Pain:  Vitals:   03/21/16 1600  TempSrc: Oral   Pain Goal:    LLE Motor Response: Purposeful movement (03/21/16 1800) LLE Sensation: Full sensation (03/21/16 1800) RLE Motor Response: Purposeful movement (03/21/16 1800) RLE Sensation: Full sensation (03/21/16 1800)      Phillips Groutarignan, Miski Feldpausch

## 2016-03-21 NOTE — Procedures (Signed)
Extubation Procedure Note  Patient Details:   Name: Jermaine Burns DOB: 09/25/1950 MRN: [id number]   Airway Documentation:     Evaluation  O2 sats: stable throughout Complications: No apparent complications Patient did tolerate procedure well. Bilateral Breath Sounds: Clear, Diminished   Yes   Pt extubated to 3L [place] per MD order. Pt stable throughout with no complications. Pt able to speak name with a weak cough. Pt encouraged to use [place] to clear secretions. RT will continue to monitor.   Carolan ShiverKelley, Eulia Hatcher M 03/21/2016, 11:15 AM

## 2016-03-21 NOTE — Progress Notes (Signed)
eLink Physician-Brief Progress Note Patient Name: Jermaine Burns DOB: 07/22/1950 MRN: [id number]   Date of Service  03/21/2016  HPI/Events of Note  Low BP SBP 100's, has been lower earlier in the day, sedation vs volume related or combo=ination of both  Neuro recs maintain SBP 120's  eICU Interventions  Will give 1 liter [place] bolus and re-assess BP, will give neo if needed     Intervention Category Major Interventions: Hypotension - evaluation and management  Isador Castille 03/21/2016, 1:14 AM

## 2016-03-21 NOTE — Progress Notes (Signed)
STROKE TEAM PROGRESS NOTE   SUBJECTIVE (INTERVAL HISTORY) Wife is at bedside. He is still intubated but off versed. Able to follow some simple commands. MRI done showed right MCA and ACA infarct, moderate size. CXR stable. BP improved to 110s. On IVF @ 100. Hopefully to be extubated today.    OBJECTIVE Temp:  [97.6 F (36.4 C)-99.8 F (37.7 C)] 99.1 F (37.3 C) (10/11 0805) Pulse Rate:  [40-77] 65 (10/11 0805) Cardiac Rhythm: Normal sinus rhythm (10/10 2000) Resp:  [13-27] 16 (10/11 0805) BP: (86-147)/(56-90) 114/63 (10/11 0805) SpO2:  [97 %-100 %] 100 % (10/11 0805) Arterial Line BP: (97-168)/(47-78) 142/57 (10/11 0615) FiO2 (%):  [30 %-60 %] 30 % (10/11 0805)  CBC:   Recent Labs Lab 03/20/16 0437 03/20/16 0442 03/21/16 0500  WBC 7.3  --  5.3  NEUTROABS 4.4  --  4.1  HGB 14.0 14.3 11.0*  HCT 43.1 42.0 32.9*  MCV 95.1  --  93.2  PLT 208  --  139*    Basic Metabolic Panel:   Recent Labs Lab 03/20/16 0437 03/20/16 0442 03/21/16 0500  NA 137 139 138  K 3.9 3.9 3.3*  CL 104 104 110  CO2 23  --  22  GLUCOSE 104* 101* 99  BUN 19 24* 8  CREATININE 0.98 0.90 0.76  CALCIUM 9.2  --  8.0*    Lipid Panel:     Component Value Date/Time   CHOL 140 03/21/2016 0500   TRIG 91 03/21/2016 0500   HDL 32 (L) 03/21/2016 0500   CHOLHDL 4.4 03/21/2016 0500   VLDL 18 03/21/2016 0500   LDLCALC 90 03/21/2016 0500   HgbA1c: No results found for: HGBA1C Urine Drug Screen:     Component Value Date/Time   LABOPIA NONE DETECTED 03/20/2016 0501   COCAINSCRNUR NONE DETECTED 03/20/2016 0501   LABBENZ NONE DETECTED 03/20/2016 0501   AMPHETMU NONE DETECTED 03/20/2016 0501   THCU NONE DETECTED 03/20/2016 0501   LABBARB NONE DETECTED 03/20/2016 0501      IMAGING I have personally reviewed the radiological images below and agree with the radiology interpretations.  Ct Head Code Stroke W/o Cm 03/20/2016 1. No acute intracranial abnormality is identified. 2. Dense left M1 may  represent thrombus. 3. ASPECTS is 10   Ct Angio Head W Or Wo Contrast Ct Angio Neck W Or Wo Contrast 03/20/2016 1. Left M2 inferior division proximal occlusion. Poor collateralization of the posterior left MCA territory. 2. Left distal A2 to proximal A3 segment occlusion. 3. 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 4. Otherwise there is no proximal vessel occlusion, high-grade stenosis, or aneurysm of the circle of Willis. 5. Carotid and vertebral arteries of the neck are widely patent. 6. Advanced cervical spondylosis with severe canal stenosis at the C4-5 and C5-6 levels.   Ct Cervical Spine Wo Contrast 03/20/2016 1. No acute fracture or dislocation of the cervical spine. 2. Advanced cervical spondylosis with multilevel canal stenosis that appears severe at the C4 through C6 levels and mild-to-moderate foraminal narrowing.   Ct Cerebral Perfusion W Contrast 03/20/2016 1. Calculated core infarct volume: 29 mL, present in the left posterior MCA distribution. 2. Calculated ischemic penumbra volume: 146 mL, is present in the left MCA and left ACA distributions. 3. Calculated mismatch volume: 117 mL and Mismatch ratio 5.0. 4. No large subacute infarct on noncontrast head CT is present to complicate calculations.   Cerebral angio 03/20/16 1.S/P complete revascularization of occlude d LT MCA with x 2 passes  with 4 mm x 40 mm Solitaire FR retriever device and 5 mg of superselective IV tpa achieving TICI 3reperfusion. 2. Progressive recanalizing Lt ACA A3 occlusion  Mr Brain Wo Contrast 03/21/2016 IMPRESSION: Acute infarct within the left lateral parietal lobe extending into the operculum, additional small foci in the left insula and left mid cingulate gyrus, and scattered punctate foci and left MCA distribution. No evidence for hemorrhagic conversion.   Dg Chest Port 1 View 03/21/2016 IMPRESSION: Stable exam.  Low volume film with left base atelectasis.   LE venous doppler  pending   PHYSICAL EXAM  Temp:  [97.6 F (36.4 C)-99.8 F (37.7 C)] 99.1 F (37.3 C) (10/11 0805) Pulse Rate:  [40-77] 65 (10/11 0805) Resp:  [13-27] 16 (10/11 0805) BP: (86-147)/(56-90) 114/63 (10/11 0805) SpO2:  [97 %-100 %] 100 % (10/11 0805) Arterial Line BP: (97-168)/(47-78) 142/57 (10/11 0615) FiO2 (%):  [30 %-60 %] 30 % (10/11 0805)  General - Well nourished, well developed, intubated off versed.  Cardiovascular - Regular rhythm, but bradycardia.  Neuro - intubated off versed, eyes open on commands, able to follow commands on tongue protrusion, eye close opening, wiggle toes but not able to show fingers on command. PERRL, EOMI, no gaze preference. Facial symmetry difficult to exam due to ET tube, tongue in middle. Moving both UEs and LEs equally on commands.Marland Kitchen. DTR 1+ and negative babinski. Gait, sensation and coordination not tested.    ASSESSMENT/PLAN Jermaine Burns is a 65 y.o. male with history of HLD and R hip fracture presenting following a fall with R hemiparesis. CTA confirmed L M2 and L A2/A3 occlusion. He received IV t-PA 03/20/16 at 0615 and was taken to IR where he received TICI 3 reperfusion of L MCA and progressive recanalization of L A3.   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 Infarct felt to be embolic secondary to unknown disease source  Resultant  intubated  CTA head and neck L M2 occlusion. L A2/A3 occlusion. R V4 fusiform aneurysm.    CT perfusion L posterior MCA, L MCA and L ACA with salvageable penumbra. Mismatch vol 117  MRI  Left MCA and ACA territory moderate sized infarct.  Cerebral angio TICI3 left M2 recannulization  2D Echo  Pending  LE venous doppler pending  Will need TEE and loop once stabilized  LDL 90   HgbA1c pending  lovenox for VTE prophylaxis Diet NPO time specified  No antithrombotic prior  to admission, now on ASA for stroke prevention.  Ongoing aggressive stroke risk factor management  Therapy recommendations:  pending   Disposition:  pending   Respiratory failure  Intubated for neuro IR  CCM on board  Off sedation and on weaning trials  Extubate as able  Hyperlipidemia  Home meds:  none  LDL 90, goal < 70  Add lipitor 20  Continue statin at discharge  Other Stroke Risk Factors  Former Cigarette smoker  Occasional ETOH use  Hospital day # 1  This patient is critically ill due to left MCA infarct s/p tPA and mechanical thrombectomy and at significant risk of neurological worsening, death form hemorrhagic transformation, recurrent stroke, shock, respiratory failure. This patient's care requires constant monitoring of vital signs, hemodynamics, respiratory and cardiac monitoring, review of multiple databases, neurological assessment, discussion with family, other specialists and medical decision making of high complexity. I had long  discussion with wife at bedside, updated her about pt condition, treatment plan and prognosis as well as further work up. I spent 40 minutes of neurocritical care time in the care of this patient.  Marvel Plan, MD PhD Stroke Neurology 03/21/2016 9:28 AM

## 2016-03-21 NOTE — Progress Notes (Signed)
Referring Physician(s): Code Stroke Marvel PlanJindong Burns  Supervising Physician: Jermaine Burns, Jermaine Burns  [facility]  [facility]  Chief Complaint:  Acute Stroke S/P complete revascularization of occlude d LT MCA with x 2 passes with 4 mm x 40 mm Solitaire FR retriever device and 5 mg of superselective IV tpa achieving TICI 3reperfusion. Progressive recanalizing Lt ACA A3 occlusion   Subjective:  [name] is now extubated.  He is a little emotional but otherwise doing ok. No complaints.  Allergies: Review of patient's allergies indicates no known allergies.  Medications: Prior to Admission medications   Medication Sig Start Date End Date Taking? Authorizing Provider  Multiple Vitamin (MULTIVITAMIN WITH MINERALS) TABS tablet Take 1 tablet by mouth daily.   Yes Historical Provider, MD     Vital Signs: BP 127/69   Pulse 89   Temp 98.2 F (36.8 C)   Resp (!) 22   Ht 5\' 10"  (1.778 m)   Wt 203 lb 14.8 oz (92.5 kg)   SpO2 97%   BMI 29.26 kg/m   Physical Exam Awake and alert Appropriate No Cranial nerve deficits Speech normal Face symmetrical EOMI Tongue midline Strength 5/5 all fours Bilateral CFA stick sites look good. No hematoma or pseudoaneurysm (still straight leg time/supine) Heart RRR  Imaging: Ct Angio Head W Or Wo Contrast  Result Date: 03/20/2016 CLINICAL DATA:  65 y/o M; difficulty speaking and right-sided weakness. EXAM: CT ANGIOGRAPHY HEAD AND NECK TECHNIQUE: Multidetector CT imaging of the head and neck was performed using the standard protocol during bolus administration of intravenous contrast. Multiplanar CT image reconstructions and MIPs were obtained to evaluate the vascular anatomy. Carotid stenosis measurements (when applicable) are obtained utilizing NASCET criteria, using the distal internal carotid diameter as the denominator. CONTRAST:  50 cc Isovue 370 COMPARISON:  03/20/2016 CT head. FINDINGS: CTA NECK FINDINGS Aortic arch: 3 vessel arch. No  significant stenosis of great vessel origins. Mild atherosclerosis with calcifications. Right carotid system: No evidence of dissection, stenosis (50% or greater) or occlusion. Mild calcified plaque at the bifurcation. Left carotid system: No evidence of dissection, stenosis (50% or greater) or occlusion. Mild calcified plaque at the bifurcation. Vertebral arteries: Codominant. No evidence of dissection, stenosis (50% or greater) or occlusion. Skeleton: Multilevel advanced cervical spondylosis with extensive discogenic degenerative changes and uncovertebral hypertrophy. There is canal stenosis throughout the cervical spine severe at the C4-5 and C5-6 levels. Additionally, there is multilevel moderate to severe bony foraminal narrowing. Other neck: No mass or inflammatory process is identified. Upper chest: Negative. Review of the MIP images confirms the above findings CTA HEAD FINDINGS Anterior circulation: Occlusion of left A2 segment just downstream to frontal polar artery origin with collateral reconstitution at the proximal A3 segment (series 406, image 15). Left M2 inferior division proximal occlusion. There is poor collateralization in the left posterior MCA territory (series 401, image 144). Otherwise there is no proximal vessel occlusion, high-grade stenosis, or aneurysm of the anterior circulation. Posterior circulation: Right V4 focal fusiform aneurysm measuring 5 mm (series 401, image 101). Otherwise there is no high-grade stenosis, aneurysm, or occlusion of the posterior circulation. Venous sinuses: Poor contrast opacification. Anatomic variants: Diminutive left posterior communicating artery. No right posterior communicating artery identified, likely hypoplastic or absent. Small anterior communicating artery Review of the MIP images confirms the above findings IMPRESSION: 1. Left M2 inferior division proximal occlusion. Poor collateralization of the posterior left MCA territory. 2. Left distal A2 to  proximal A3 segment occlusion. 3. 5 mm  fusiform aneurysm of right V4 segment. 4. Otherwise there is no proximal vessel occlusion, high-grade stenosis, or aneurysm of the circle of Willis. 5. Carotid and vertebral arteries of the neck are widely patent. 6. Advanced cervical spondylosis with severe canal stenosis at the C4-5 and C5-6 levels. These results were called by telephone at the time of interpretation on 03/20/2016 at 5:39 am to Dr. Rhona Burns , who verbally acknowledged these results. Electronically Signed   By: Jermaine Burns M.D.   On: 03/20/2016 05:50   Ct Head Wo Contrast  Result Date: 03/20/2016 CLINICAL DATA:  Left MCA and ACA thrombus. Status post endovascular therapy. EXAM: CT HEAD WITHOUT CONTRAST TECHNIQUE: Contiguous axial images were obtained from the base of the skull through the vertex without intravenous contrast. COMPARISON:  Earlier today, most recently at 4:50 a.m. FINDINGS: Brain: Gray-white blurring in the left parietal region, correlating with infarct core seen on previous cerebral perfusion. Infarct is also seen in the inferior posterior left insula. No hemorrhagic conversion. No shift or hydrocephalus. Vascular: Symmetric vessel density. Skull: Negative Sinuses/Orbits: Negative IMPRESSION: 1. Acute infarct now visible in the left parietal and posterior insular regions, correlating with infarct core on prior CT perfusion. 2. No hemorrhagic conversion. Electronically Signed   By: Jermaine Burns M.D.   On: 03/20/2016 09:50   Ct Angio Neck W Or Wo Contrast  Result Date: 03/20/2016 CLINICAL DATA:  65 y/o M; difficulty speaking and right-sided weakness. EXAM: CT ANGIOGRAPHY HEAD AND NECK TECHNIQUE: Multidetector CT imaging of the head and neck was performed using the standard protocol during bolus administration of intravenous contrast. Multiplanar CT image reconstructions and MIPs were obtained to evaluate the vascular anatomy. Carotid stenosis measurements (when  applicable) are obtained utilizing NASCET criteria, using the distal internal carotid diameter as the denominator. CONTRAST:  50 cc Isovue 370 COMPARISON:  03/20/2016 CT head. FINDINGS: CTA NECK FINDINGS Aortic arch: 3 vessel arch. No significant stenosis of great vessel origins. Mild atherosclerosis with calcifications. Right carotid system: No evidence of dissection, stenosis (50% or greater) or occlusion. Mild calcified plaque at the bifurcation. Left carotid system: No evidence of dissection, stenosis (50% or greater) or occlusion. Mild calcified plaque at the bifurcation. Vertebral arteries: Codominant. No evidence of dissection, stenosis (50% or greater) or occlusion. Skeleton: Multilevel advanced cervical spondylosis with extensive discogenic degenerative changes and uncovertebral hypertrophy. There is canal stenosis throughout the cervical spine severe at the C4-5 and C5-6 levels. Additionally, there is multilevel moderate to severe bony foraminal narrowing. Other neck: No mass or inflammatory process is identified. Upper chest: Negative. Review of the MIP images confirms the above findings CTA HEAD FINDINGS Anterior circulation: Occlusion of left A2 segment just downstream to frontal polar artery origin with collateral reconstitution at the proximal A3 segment (series 406, image 15). Left M2 inferior division proximal occlusion. There is poor collateralization in the left posterior MCA territory (series 401, image 144). Otherwise there is no proximal vessel occlusion, high-grade stenosis, or aneurysm of the anterior circulation. Posterior circulation: Right V4 focal fusiform aneurysm measuring 5 mm (series 401, image 101). Otherwise there is no high-grade stenosis, aneurysm, or occlusion of the posterior circulation. Venous sinuses: Poor contrast opacification. Anatomic variants: Diminutive left posterior communicating artery. No right posterior communicating artery identified, likely hypoplastic or absent.  Small anterior communicating artery Review of the MIP images confirms the above findings IMPRESSION: 1. Left M2 inferior division proximal occlusion. Poor collateralization of the posterior left MCA territory. 2. Left distal A2 to proximal A3  segment occlusion. 3. 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 4. Otherwise there is no proximal vessel occlusion, high-grade stenosis, or aneurysm of the circle of Willis. 5. Carotid and vertebral arteries of the neck are widely patent. 6. Advanced cervical spondylosis with severe canal stenosis at the C4-5 and C5-6 levels. These results were called by telephone at the time of interpretation on 03/20/2016 at 5:39 am to Dr. Rhona Burns , who verbally acknowledged these results. Electronically Signed   By: Jermaine Burns M.D.   On: 03/20/2016 05:50   Ct Cervical Spine Wo Contrast  Result Date: 03/20/2016 CLINICAL DATA:  65 y/o  M; unwitnessed fall. EXAM: CT CERVICAL SPINE WITHOUT CONTRAST TECHNIQUE: Multidetector CT imaging of the cervical spine was performed without intravenous contrast. Multiplanar CT image reconstructions were also generated. COMPARISON:  None. FINDINGS: Alignment: Normal cervical lordosis.  No listhesis. Skull base and vertebrae: No acute fracture. No primary bone lesion or focal pathologic process. Soft tissues and spinal canal: No prevertebral fluid or swelling. No visible canal hematoma. Disc levels: There is multilevel advanced cervical spondylosis with extensive discogenic changes and uncovertebral hypertrophy throughout the cervical spine there is multilevel canal stenosis that appears severe at the C4-5 and C5-6 levels. Additionally, there is multilevel moderate to severe bony foraminal narrowing. Upper chest: Negative. Other: No mass or inflammatory process is identified on this noncontrast examination. IMPRESSION: 1. No acute fracture or dislocation of the cervical spine. 2. Advanced cervical spondylosis with multilevel canal  stenosis that appears severe at the C4 through C6 levels and mild-to-moderate foraminal narrowing. Electronically Signed   By: Jermaine Burns M.D.   On: 03/20/2016 05:13   Mr Brain Wo Contrast  Result Date: 03/21/2016 CLINICAL DATA:  65 y/o M; status post left ACA and MCA occlusion post intra-arterial revascularization. EXAM: MRI HEAD WITHOUT CONTRAST TECHNIQUE: Multiplanar, multiecho pulse sequences of the brain and surrounding structures were obtained without intravenous contrast. COMPARISON:  03/20/2016 CT head, CT perfusion, and CT angiogram. FINDINGS: Brain: Areas of diffusion restriction within the left posterior insula, left lateral parietal lobe extending into operculum, and a few additional scattered punctate foci throughout the left MCA distribution. Single small focus of diffusion restriction within the left mid cingulate gyrus. Areas of infarction demonstrate T2 FLAIR hyperintensity. There is an additional background of scattered foci of T2 FLAIR hyperintensity in subcortical and periventricular white matter compatible with mild chronic microvascular ischemic changes. Mild parenchymal volume loss. No abnormal susceptibility hypointensity of the brain parenchyma to suggest intracranial hemorrhage. Vascular: No signal abnormality of central flow voids. Skull and upper cervical spine: Normal marrow signal. Sinuses/Orbits: Moderate diffuse paranasal sinus mucosal thickening probably due to intubation. No significant abnormal signal of mastoid air cells. Orbits are unremarkable. Other: None. IMPRESSION: Acute infarct within the left lateral parietal lobe extending into the operculum, additional small foci in the left insula and left mid cingulate gyrus, and scattered punctate foci and left MCA distribution. No evidence for hemorrhagic conversion. Electronically Signed   By: Jermaine Burns M.D.   On: 03/21/2016 06:22   Ct Cerebral Perfusion W Contrast  Result Date:  03/20/2016 CLINICAL DATA:  65 y/o  M; right-sided weakness. EXAM: CT CEREBRAL PERFUSION WITH CONTRAST TECHNIQUE: CT was perfusion was performed with automatic post processing by RAPID software. CONTRAST:  40 cc Isovue 370 COMPARISON:  CT angiogram head and neck 03/20/2016 per FINDINGS: Good quality arterial input and venous outflow functions. Mild patient motion. CBF less than 30% volume:  29 mL T-max greater  than 6 seconds volume: 146 mL Mismatch volume: 117 mL Mismatch ratio 5.0 Cord ischemia is present in the left posterior MCA distribution. Ischemic penumbra is present in both the left ACA and left MCA distributions. No large subacute infarct on noncontrast head CT. IMPRESSION: 1. Calculated core infarct volume: 29 mL, present in the left posterior MCA distribution. 2. Calculated ischemic penumbra volume: 146 mL, is present in the left MCA and left ACA distributions. 3. Calculated mismatch volume: 117 mL and Mismatch ratio 5.0. 4. No large subacute infarct on noncontrast head CT is present to complicate calculations. Electronically Signed   By: Jermaine Burns M.D.   On: 03/20/2016 06:43   Dg Chest Port 1 View  Result Date: 03/21/2016 CLINICAL DATA:  Ventilator dependence. EXAM: PORTABLE CHEST 1 VIEW COMPARISON:  03/20/2016. FINDINGS: 0645 hours. Endotracheal tube tip is approximately 4.6 cm above the base of the carina. Lung volumes remain low in this patient rotated to the right. There is some persistent left base streaky opacity, likely atelectatic. Right lung is clear. Cardiopericardial silhouette is at upper limits of normal for size. The visualized bony structures of the thorax are intact. Telemetry leads overlie the chest. IMPRESSION: Stable exam.  Low volume film with left base atelectasis. Electronically Signed   By: Kennith Center M.D.   On: 03/21/2016 09:13   Dg Chest Port 1 View  Result Date: 03/20/2016 CLINICAL DATA:  65 year old male intubated. Right-sided weakness and possible  stroke. Initial encounter. EXAM: PORTABLE CHEST 1 VIEW COMPARISON:  None. FINDINGS: Endotracheal tube tip midline. Carina difficult adequately assess although I suspect the tip of the endotracheal tube is 3 cm above the carina. Prominence of the mediastinum. This may be related to technique and poor inspiration but will need to be evaluated on follow-up with better inspiration. Pulmonary vascular prominence most notable centrally. Heart size within normal limits. IMPRESSION: Endotracheal tube tip midline 3 cm above the carina. Prominence of the mediastinum. This may be related to technique and poor inspiration but will need to be evaluated on follow-up with better inspiration. Pulmonary vascular prominence most notable centrally. Electronically Signed   By: Lacy Duverney M.D.   On: 03/20/2016 11:45   Ct Head Code Stroke W/o Cm  Result Date: 03/20/2016 CLINICAL DATA:  Code stroke. 65 y/o M; unwitnessed fall with dysphasia. EXAM: CT HEAD WITHOUT CONTRAST TECHNIQUE: Contiguous axial images were obtained from the base of the skull through the vertex without intravenous contrast. COMPARISON:  None. FINDINGS: Brain: No evidence of acute infarction, hemorrhage, hydrocephalus, extra-axial collection or mass lesion/mass effect. Background of mild to moderate parenchymal volume loss and chronic microvascular ischemic changes. Vascular: Dense left M1 segment best appreciated on the coronal sequence (series 4, image 41). Skull: Normal. Negative for fracture or focal lesion. Sinuses/Orbits: No acute finding. Other: None. ASPECTS [facility] Stroke Program Early CT Score) - Ganglionic level infarction (caudate, lentiform nuclei, internal capsule, insula, M1-M3 cortex): 7 - Supraganglionic infarction (M4-M6 cortex): 3 Total score (0-10 with 10 being normal): 10 IMPRESSION: 1. No acute intracranial abnormality is identified. 2. Dense left M1 may represent thrombus. 3. ASPECTS is 10 These results were called by telephone at the  time of interpretation on 03/20/2016 at 5:07 am to Dr. Tomasita Crumble , who verbally acknowledged these results. Electronically Signed   By: Jermaine Burns M.D.   On: 03/20/2016 05:09    Labs:  CBC:  Recent Labs  03/20/16 0437 03/20/16 0442 03/21/16 0500  WBC 7.3  --  5.3  HGB  14.0 14.3 11.0*  HCT 43.1 42.0 32.9*  PLT 208  --  139*    COAGS:  Recent Labs  03/20/16 0437  INR 1.01  APTT 27    BMP:  Recent Labs  03/20/16 0437 03/20/16 0442 03/21/16 0500  NA 137 139 138  K 3.9 3.9 3.3*  CL 104 104 110  CO2 23  --  22  GLUCOSE 104* 101* 99  BUN 19 24* 8  CALCIUM 9.2  --  8.0*  CREATININE 0.98 0.90 0.76  GFRNONAA >60  --  >60  GFRAA >60  --  >60    LIVER FUNCTION TESTS:  Recent Labs  03/20/16 0437  BILITOT 0.4  AST 27  ALT 21  ALKPHOS 63  PROT 6.5  ALBUMIN 4.1    Assessment and Plan:  Acute CVA  S/P complete revascularization of occlude d LT MCA with x 2 passes with 4 mm x 40 mm Solitaire FR retriever device and 5 mg of superselective IV tpa achieving TICI 3reperfusion and progressive recanalizing Lt ACA A3 occlusion by Dr. Corliss Skains  Continue current care.   Agree with aspirin.  Electronically Signed: Gwynneth Macleod PA-C 03/21/2016, 1:56 PM   I spent a total of 15 Minutes at the the patient's bedside AND on the patient's hospital floor or unit, greater than 50% of which was counseling/coordinating care for f/u after stroke/intervention

## 2016-03-21 NOTE — Progress Notes (Signed)
PT Cancellation Note  Patient Details Name: Jermaine Burns MRN: [id number] DOB: 09/12/1950   Cancelled Treatment:    Reason Eval/Treat Not Completed: Medical issues which prohibited therapy.  Pt intubated, sedated, and on bedrest.  Please update bedrest order when ready to mobilize.    Thanks,    Rollene Rotundaebecca B. Mariam Helbert, PT, DPT [phone]   03/21/2016, 8:20 AM

## 2016-03-21 NOTE — Progress Notes (Signed)
SLP Cancellation Note  Patient Details Name: Jermaine Burns MRN: [id number] DOB: 03/06/1951   Cancelled treatment:       Reason Eval/Treat Not Completed: Patient not medically ready, remains on the vent this morning. Will continue to follow.   Maxcine Hamaiewonsky, Lineth Thielke 03/21/2016, 8:44 AM  Maxcine HamLaura Paiewonsky, M.A. CCC-SLP [phone]

## 2016-03-21 NOTE — Progress Notes (Signed)
PULMONARY / CRITICAL CARE MEDICINE   Name: Jermaine Burns MRN: [id number] DOB: 09/12/1950    ADMISSION DATE:  03/20/2016 CONSULTATION DATE:  03/20/2016  REFERRING MD:  Dr. 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 Upon EMS arrival the patient was globally aphasic and was unable to describe the events of his fall, however, they did note that he was having some flaccid right hemiparesis and they emergently transported to the emergency department to [facility] as a code stroke. Upon arrival to the emergency department he was able to have movement of his right side however he did remain aphasic. Emergent head CT showed possible acute stroke. Initially there were some questions about timeline and last known well time. However, this was clarified and he was given IV TPA. CT perfusion scan done showed a large amount of penumbra so he was considered, and taken to interventional radiology under Dr. Corliss Skainseveshwar where complete revascularization of occluded left MCA and left ACA was achieved. After the procedure he was sent to the ICU intubated for recovery. PCCM asked to see.  PAST MEDICAL HISTORY :  He  has a past medical history of Hypercholesterolemia.  PAST SURGICAL HISTORY: He  has a past surgical history that includes Joint replacement.  No Known Allergies  No current facility-administered medications on file prior to encounter.    No current outpatient prescriptions on file prior to encounter.    FAMILY HISTORY:  His has no family status information on file.    SOCIAL HISTORY: He  reports that he has quit smoking. His smoking use included Cigarettes. He has never used smokeless tobacco. He reports  that he drinks alcohol.  REVIEW OF SYSTEMS:   Unable as patient is sedated and intubated.  SUBJECTIVE:    VITAL SIGNS: BP 130/69   Pulse 77   Temp 99.1 F (37.3 C) (Axillary)   Resp 10   Ht 5\' 10"  (1.778 m)   Wt 92.5 kg (203 lb 14.8 oz)   SpO2 100%   BMI 29.26 kg/m   HEMODYNAMICS:    VENTILATOR SETTINGS: Vent Mode: CPAP;PSV FiO2 (%):  [30 %-60 %] 30 % Set Rate:  [16 bmp] 16 bmp Vt Set:  [580 mL] 580 mL PEEP:  [5 cmH20] 5 cmH20 Pressure Support:  [5 cmH20] 5 cmH20 Plateau Pressure:  [16 cmH20-20 cmH20] 19 cmH20  INTAKE / OUTPUT: I/O last 3 completed shifts: In: 4086.3 [I.V.:3986.3; IV Piggyback:100] Out: 1765 [Urine:1735; Blood:30]  PHYSICAL EXAMINATION: General:  Male of normal body habitus in NAD Neuro:  Awake on vent, calm, following commands x 4 extremities HEENT:  [place]/AT, ETT Cardiovascular:  RRR, no MRG Lungs:  clear Abdomen:  Soft, non-distended. + BS Musculoskeletal:  No acute deformity  Skin:  Grossly intact  LABS:  BMET  Recent Labs Lab 03/20/16 0437 03/20/16 0442 03/21/16 0500  NA 137 139 138  K 3.9 3.9 3.3*  CL 104 104 110  CO2 23  --  22  BUN 19 24* 8  CREATININE 0.98 0.90 0.76  GLUCOSE 104* 101* 99    Electrolytes  Recent Labs Lab 03/20/16 0437 03/21/16 0500  CALCIUM 9.2 8.0*    CBC  Recent Labs Lab 03/20/16 0437 03/20/16 0442 03/21/16 0500  WBC  7.3  --  5.3  HGB 14.0 14.3 11.0*  HCT 43.1 42.0 32.9*  PLT 208  --  139*    Coag's  Recent Labs Lab 03/20/16 0437  APTT 27  INR 1.01    Sepsis Markers No results for input(s): LATICACIDVEN, PROCALCITON, O2SATVEN in the last 168 hours.  ABG  Recent Labs Lab 03/20/16 1230  PHART 7.396  PCO2ART 37.1  PO2ART 224*    Liver Enzymes  Recent Labs Lab 03/20/16 0437  AST 27  ALT 21  ALKPHOS 63  BILITOT 0.4  ALBUMIN 4.1    Cardiac Enzymes No results for input(s): TROPONINI, PROBNP in the last 168 hours.  Glucose  Recent Labs Lab 03/20/16 0532   GLUCAP 98    Imaging Mr Brain Wo Contrast  Result Date: 03/21/2016 CLINICAL DATA:  65 y/o M; status post left ACA and MCA occlusion post intra-arterial revascularization. EXAM: MRI HEAD WITHOUT CONTRAST TECHNIQUE: Multiplanar, multiecho pulse sequences of the brain and surrounding structures were obtained without intravenous contrast. COMPARISON:  03/20/2016 CT head, CT perfusion, and CT angiogram. FINDINGS: Brain: Areas of diffusion restriction within the left posterior insula, left lateral parietal lobe extending into operculum, and a few additional scattered punctate foci throughout the left MCA distribution. Single small focus of diffusion restriction within the left mid cingulate gyrus. Areas of infarction demonstrate T2 FLAIR hyperintensity. There is an additional background of scattered foci of T2 FLAIR hyperintensity in subcortical and periventricular white matter compatible with mild chronic microvascular ischemic changes. Mild parenchymal volume loss. No abnormal susceptibility hypointensity of the brain parenchyma to suggest intracranial hemorrhage. Vascular: No signal abnormality of central flow voids. Skull and upper cervical spine: Normal marrow signal. Sinuses/Orbits: Moderate diffuse paranasal sinus mucosal thickening probably due to intubation. No significant abnormal signal of mastoid air cells. Orbits are unremarkable. Other: None. IMPRESSION: Acute infarct within the left lateral parietal lobe extending into the operculum, additional small foci in the left insula and left mid cingulate gyrus, and scattered punctate foci and left MCA distribution. No evidence for hemorrhagic conversion. Electronically Signed   By: Mitzi Hansen M.D.   On: 03/21/2016 06:22   Dg Chest Port 1 View  Result Date: 03/21/2016 CLINICAL DATA:  Ventilator dependence. EXAM: PORTABLE CHEST 1 VIEW COMPARISON:  03/20/2016. FINDINGS: 0645 hours. Endotracheal tube tip is approximately 4.6 cm above the  base of the carina. Lung volumes remain low in this patient rotated to the right. There is some persistent left base streaky opacity, likely atelectatic. Right lung is clear. Cardiopericardial silhouette is at upper limits of normal for size. The visualized bony structures of the thorax are intact. Telemetry leads overlie the chest. IMPRESSION: Stable exam.  Low volume film with left base atelectasis. Electronically Signed   By: Kennith Center M.D.   On: 03/21/2016 09:13   Dg Chest Port 1 View  Result Date: 03/20/2016 CLINICAL DATA:  65 year old male intubated. Right-sided weakness and possible stroke. Initial encounter. EXAM: PORTABLE CHEST 1 VIEW COMPARISON:  None. FINDINGS: Endotracheal tube tip midline. Carina difficult adequately assess although I suspect the tip of the endotracheal tube is 3 cm above the carina. Prominence of the mediastinum. This may be related to technique and poor inspiration but will need to be evaluated on follow-up with better inspiration. Pulmonary vascular prominence most notable centrally. Heart size within normal limits. IMPRESSION: Endotracheal tube tip midline 3 cm above the carina. Prominence of the mediastinum. This may be related to technique and poor inspiration but will need to  be evaluated on follow-up with better inspiration. Pulmonary vascular prominence most notable centrally. Electronically Signed   By: Lacy Duverney M.D.   On: 03/20/2016 11:45     STUDIES:  -CT head 10/10 > Dense left M1 may represent thrombus. -CT angio/perfusion 10/10 > Left M2 inferior division proximal occlusion. Poor collateralization of the posterior left MCA territory. Left distal A2 to proximal A3 segment occlusion. 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 Otherwise there is no proximal vessel occlusion, high-grade stenosis, or aneurysm of the circle of Willis. Carotid and vertebral arteries of the neck are widely patent. Advanced cervical spondylosis with severe canal stenosis at  the C4-5 and C5-6 levels. -MRI 10/11 > Acute infarct within the left lateral parietal lobe extending into the operculum, additional small foci in the left insula and left mid cingulate gyrus, and scattered punctate foci and left MCA distribution ECHO >>>  CULTURES: NA  ANTIBIOTICS: NA  SIGNIFICANT EVENTS: 10/10 acute CVA, IV TPA given, to IR for revascularization  LINES/TUBES: ETT 10/10 > Fem sheath 10/10 > 10/11  DISCUSSION: 65 year old male suffered acute L MCA and ACA infarct. He was given IV TPA in the emergency room and then went to IR for revascularization. He is then sent to ICU for recovery on ventilator.  ASSESSMENT / PLAN:  PULMONARY A: Inability to protect airway in setting acute stroke and medical sedation.   P:   Will wean with plans for extubation to [place] today  CARDIOVASCULAR  A:  Hx hyperlipidemia  P:  Telemetry Keep SBP < 160 Labetalol PRN for SBP goal Statin for HLD and CVA  RENAL A:   Hypokalemia  P:   Repeat BMP in AM KCL 20 meq VT  GASTROINTESTINAL A:   At risk dysphagia  P:   SLP eval once extubated Pepcid for SUP NPO for now  HEMATOLOGIC A:   No acute issues  P:  Repeat CBC in AM  INFECTIOUS A:   No acute issues  P:   Follow WBC and fever curves  ENDOCRINE A:   No acute issues    P:   Monitor glucose on BMP  NEUROLOGIC A:   Acute L MCA and ACA infarct (s/p IV tpa and IR revascularization 10/10)  P:   RASS goal: -1 to -2 Propofol for sedation Management per stoke team Frequent neuro checks   FAMILY  - Updates: Wife and other family members updated bedside  - Inter-disciplinary family meet or Palliative Care meeting due by:  10/17   Joneen Roach, AGACNP-BC [facility] Pager [phone] or [phone]  03/21/2016 10:37 AM  Attending note: I have seen and examined the patient with nurse practitioner/resident and agree with the note. History, labs and imaging reviewed.  65  Y/O with PMH of hypercholesterolemia. Admitted with acute LT CA stroke. S/p TPA and IR revascularization. He did well on weaning trial and was successfully extubated today AM. Stroke care as per Neuro service.   The patient is critically ill with multiple organ systems failure and requires high complexity decision making for assessment and support, frequent evaluation and titration of therapies, application of advanced monitoring technologies and extensive interpretation of multiple databases.  Critical care time - 35 mins. This represents my time independent of the NPs time taking care of the pt.  Chilton Greathouse MD [facility] Pager [phone] If no answer or after 3pm call: [phone] 03/21/2016, 3:56 PM

## 2016-03-21 NOTE — Plan of Care (Signed)
Problem: Phase II Progression Outcomes Goal: Tolerates diet Outcome: Progressing Diet ordered.  Passed bedside swallow eval  Goal: Activity appropriate for discharge plan Outcome: Progressing Off of bedrest at 1530 today   Problem: Self-Care: Goal: Ability to participate in self-care as condition permits will improve Outcome: Progressing Extubated; moving all extremities.  Appropriate.  PT ordered   Problem: Nutrition: Goal: Dietary intake will improve Outcome: Progressing Diet ordered   Problem: Nutrition: Goal: Risk of aspiration will decrease Outcome: Progressing Passed swallow eval

## 2016-03-21 NOTE — Progress Notes (Signed)
03-21-16 Neuro IR Rt 9 french femoral sheath removed at 1021.  Manual pressure applied to achieve hemostasis at 1120.  Distal pulses intact.  No acute complications. Site reviewed with RN.  Pressure dressing applied.  Lt 4 french femoral sheath removed at 1040.  Manual pressure applied to achieve hemostasis at 1115.  Distal pulses intact. No acute complications.  Site reviewed with RN.  Pressure dressing applied.  Mekiah Cambridge RT-R Juliet King-Cushman RT-R

## 2016-03-22 ENCOUNTER — Encounter: Payer: Self-pay | Admitting: Neurology

## 2016-03-22 ENCOUNTER — Inpatient Hospital Stay (HOSPITAL_COMMUNITY): Payer: Federal, State, Local not specified - PPO

## 2016-03-22 DIAGNOSIS — I639 Cerebral infarction, unspecified: Secondary | ICD-10-CM

## 2016-03-22 DIAGNOSIS — I63412 Cerebral infarction due to embolism of left middle cerebral artery: Secondary | ICD-10-CM

## 2016-03-22 LAB — CBC
HEMATOCRIT: 34.6 % — AB (ref 39.0–52.0)
Hemoglobin: 11.2 g/dL — ABNORMAL LOW (ref 13.0–17.0)
MCH: 30.6 pg (ref 26.0–34.0)
MCHC: 32.4 g/dL (ref 30.0–36.0)
MCV: 94.5 fL (ref 78.0–100.0)
Platelets: 134 10*3/uL — ABNORMAL LOW (ref 150–400)
RBC: 3.66 MIL/uL — ABNORMAL LOW (ref 4.22–5.81)
RDW: 13 % (ref 11.5–15.5)
WBC: 6.4 10*3/uL (ref 4.0–10.5)

## 2016-03-22 LAB — BASIC METABOLIC PANEL
Anion gap: 3 — ABNORMAL LOW (ref 5–15)
BUN: 9 mg/dL (ref 6–20)
CALCIUM: 8 mg/dL — AB (ref 8.9–10.3)
CO2: 27 mmol/L (ref 22–32)
CREATININE: 0.78 mg/dL (ref 0.61–1.24)
Chloride: 109 mmol/L (ref 101–111)
GFR calc non Af Amer: >60 mL/min (ref >60)
GLUCOSE: 107 mg/dL — AB (ref 65–99)
Potassium: 3.7 mmol/L (ref 3.5–5.1)
Sodium: 139 mmol/L (ref 135–145)

## 2016-03-22 LAB — GLUCOSE, CAPILLARY: Glucose-Capillary: 126 mg/dL — ABNORMAL HIGH (ref 65–99)

## 2016-03-22 LAB — HEMOGLOBIN A1C
Hgb A1c MFr Bld: 5.2 % (ref 4.8–5.6)
Mean Plasma Glucose: 103 mg/dL

## 2016-03-22 NOTE — Progress Notes (Signed)
STROKE TEAM PROGRESS NOTE   SUBJECTIVE (INTERVAL HISTORY) Wife is at bedside. He was extubated and transfer to floor yesterday. He feels he is back to his baseline. DVT showed indeterminate aged DVT but no acute DVT. Will need TEE and loop.      OBJECTIVE Temp:  [97.9 F (36.6 C)-99 F (37.2 C)] 98 F (36.7 C) (10/12 2100) Pulse Rate:  [55-73] 55 (10/12 2100) Cardiac Rhythm: Heart block (10/12 1900) Resp:  [16-20] 16 (10/12 2100) BP: (110-143)/(47-78) 143/73 (10/12 2100) SpO2:  [93 %-99 %] 99 % (10/12 2100)  CBC:   Recent Labs Lab 03/20/16 0437  03/21/16 0500 03/22/16 0609  WBC 7.3  --  5.3 6.4  NEUTROABS 4.4  --  4.1  --   HGB 14.0  < > 11.0* 11.2*  HCT 43.1  < > 32.9* 34.6*  MCV 95.1  --  93.2 94.5  PLT 208  --  139* 134*  < > = values in this interval not displayed.  Basic Metabolic Panel:   Recent Labs Lab 03/21/16 0500 03/22/16 0609  NA 138 139  K 3.3* 3.7  CL 110 109  CO2 22 27  GLUCOSE 99 107*  BUN 8 9  CREATININE 0.76 0.78  CALCIUM 8.0* 8.0*    Lipid Panel:     Component Value Date/Time   CHOL 140 03/21/2016 0500   TRIG 91 03/21/2016 0500   HDL 32 (L) 03/21/2016 0500   CHOLHDL 4.4 03/21/2016 0500   VLDL 18 03/21/2016 0500   LDLCALC 90 03/21/2016 0500   HgbA1c:  Lab Results  Component Value Date   HGBA1C 5.2 03/21/2016   Urine Drug Screen:     Component Value Date/Time   LABOPIA NONE DETECTED 03/20/2016 0501   COCAINSCRNUR NONE DETECTED 03/20/2016 0501   LABBENZ NONE DETECTED 03/20/2016 0501   AMPHETMU NONE DETECTED 03/20/2016 0501   THCU NONE DETECTED 03/20/2016 0501   LABBARB NONE DETECTED 03/20/2016 0501      IMAGING I have personally reviewed the radiological images below and agree with the radiology interpretations.  Ct Head Code Stroke W/o Cm 03/20/2016 1. No acute intracranial abnormality is identified. 2. Dense left M1 may represent thrombus. 3. ASPECTS is 10   Ct Angio Head W Or Wo Contrast Ct Angio Neck W Or Wo  Contrast 03/20/2016 1. Left M2 inferior division proximal occlusion. Poor collateralization of the posterior left MCA territory. 2. Left distal A2 to proximal A3 segment occlusion. 3. 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 4. Otherwise there is no proximal vessel occlusion, high-grade stenosis, or aneurysm of the circle of Willis. 5. Carotid and vertebral arteries of the neck are widely patent. 6. Advanced cervical spondylosis with severe canal stenosis at the C4-5 and C5-6 levels.   Ct Cervical Spine Wo Contrast 03/20/2016 1. No acute fracture or dislocation of the cervical spine. 2. Advanced cervical spondylosis with multilevel canal stenosis that appears severe at the C4 through C6 levels and mild-to-moderate foraminal narrowing.   Ct Cerebral Perfusion W Contrast 03/20/2016 1. Calculated core infarct volume: 29 mL, present in the left posterior MCA distribution. 2. Calculated ischemic penumbra volume: 146 mL, is present in the left MCA and left ACA distributions. 3. Calculated mismatch volume: 117 mL and Mismatch ratio 5.0. 4. No large subacute infarct on noncontrast head CT is present to complicate calculations.   Cerebral angio 03/20/16 1.S/P complete revascularization of occlude d LT MCA with x 2 passes with 4 mm x 40 mm Solitaire FR  retriever device and 5 mg of superselective IV tpa achieving TICI 3reperfusion. 2. Progressive recanalizing Lt ACA A3 occlusion  Mr Brain Wo Contrast 03/21/2016 IMPRESSION: Acute infarct within the left lateral parietal lobe extending into the operculum, additional small foci in the left insula and left mid cingulate gyrus, and scattered punctate foci and left MCA distribution. No evidence for hemorrhagic conversion.   Dg Chest Port 1 View 03/21/2016 IMPRESSION: Stable exam.  Low volume film with left base atelectasis.   TTE  - Left ventricle: The cavity size was normal. There was mild focal   basal hypertrophy of the septum. Systolic function was  normal.   The estimated ejection fraction was in the range of 60% to 65%.   Wall motion was normal; there were no regional wall motion   abnormalities. Doppler parameters are consistent with abnormal   left ventricular relaxation (grade 1 diastolic dysfunction). - Aortic valve: Mildly calcified annulus. Trileaflet; normal   thickness leaflets. - Left atrium: The atrium was mildly dilated. Impressions: - No cardiac source of emboli was indentified.  LE venous doppler  There is age indeterminate DVT noted in the left femoral, popliteal, and posterior tibial veins.   TEE and loop pending   PHYSICAL EXAM  Temp:  [97.9 F (36.6 C)-99 F (37.2 C)] 98 F (36.7 C) (10/12 2100) Pulse Rate:  [55-73] 55 (10/12 2100) Resp:  [16-20] 16 (10/12 2100) BP: (110-143)/(47-78) 143/73 (10/12 2100) SpO2:  [93 %-99 %] 99 % (10/12 2100)  General - Well nourished, well developed, in no apparent distress.  Ophthalmologic - Sharp disc margins OU.   Cardiovascular - Regular rate and rhythm.  Mental Status -  Level of arousal and orientation to time, place, and person were intact. Flat affect.  Language including expression, naming, repetition, comprehension was assessed and found intact. Fund of Knowledge was assessed and was intact.  Cranial Nerves II - XII - II - Visual field intact OU. III, IV, VI - Extraocular movements intact. V - Facial sensation intact bilaterally. VII - Facial movement intact bilaterally. VIII - Hearing & vestibular intact bilaterally. X - Palate elevates symmetrically. XI - Chin turning & shoulder shrug intact bilaterally. XII - Tongue protrusion intact.  Motor Strength - The patient's strength was normal in all extremities and pronator drift was absent.  Bulk was normal and fasciculations were absent.   Motor Tone - Muscle tone was assessed at the neck and appendages and was normal.  Reflexes - The patient's reflexes were 1+ in all extremities and he had no  pathological reflexes.  Sensory - Light touch, temperature/pinprick were assessed and were symmetrical.    Coordination - The patient had normal movements in the hands and feet with no ataxia or dysmetria.  Tremor was absent.  Gait and Station -deferred.   ASSESSMENT/PLAN Mr. DESHON HSIAO is a 65 y.o. male with history of HLD and R hip fracture presenting following a fall with R hemiparesis. CTA confirmed L M2 and L A2/A3 occlusion. He received IV t-PA 03/20/16 at 0615 and was taken to IR where he received TICI 3 reperfusion of L MCA and progressive recanalization of L A3.   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 Infarct felt to be embolic secondary to unknown disease source  Resultant  intubated  CTA head and neck L M2 occlusion. L A2/A3 occlusion. R V4  fusiform aneurysm.    CT perfusion L posterior MCA, L MCA and L ACA with salvageable penumbra. Mismatch vol 117  MRI  Left MCA and ACA territory moderate sized infarct.  Cerebral angio TICI3 left M2 recannulization  2D Echo  EF 60-65%  LE venous doppler age indeterminate DVT at LLE  TEE and loop pending  LDL 90   HgbA1c 5.2  lovenox for VTE prophylaxis Diet regular Room service appropriate? Yes; Fluid consistency: Thin Diet NPO time specified  No antithrombotic prior to admission, now on ASA 325mg  for stroke prevention.  Ongoing aggressive stroke risk factor management  Therapy recommendations:  HH PT/OT   Disposition:  pending   Hyperlipidemia  Home meds:  none  LDL 90, goal < 70  Add lipitor 20  Continue statin at discharge  Other Stroke Risk Factors  Former Cigarette smoker  Occasional ETOH use  Hospital day # 2   Marvel PlanJindong Hailey Stormer, MD PhD Stroke Neurology 03/22/2016 10:36 PM

## 2016-03-22 NOTE — Progress Notes (Signed)
Referring Physician(s): Dr Marvel Plan  Supervising Physician: Julieanne Cotton  Patient Status:  [facility] - In-pt  Chief Complaint:  L MCA Revascularization in IR 10/10  Subjective:  Better daily Being evaluated now by PT Has no complaints Feels "back to normal"  Allergies: Review of patient's allergies indicates no known allergies.  Medications: Prior to Admission medications   Medication Sig Start Date End Date Taking? Authorizing Provider  Multiple Vitamin (MULTIVITAMIN WITH MINERALS) TABS tablet Take 1 tablet by mouth daily.   Yes Historical Provider, MD     Vital Signs: BP (!) 124/55 (BP Location: Left Arm)   Pulse 73   Temp 98.3 F (36.8 C) (Oral)   Resp 18   Ht 5\' 10"  (1.778 m)   Wt 203 lb 14.8 oz (92.5 kg)   SpO2 96%   BMI 29.26 kg/m   Physical Exam  Constitutional: He is oriented to person, place, and time. He appears well-nourished.  Pulmonary/Chest: Effort normal.  Abdominal: Soft. Bowel sounds are normal.  Musculoskeletal: Normal range of motion.  Neurological: He is alert and oriented to person, place, and time.  Skin: Skin is warm and dry.  B groins: soft; NT No bleeding No hematoma  B feet: 2+ pulses     Psychiatric: He has a normal mood and affect. His behavior is normal. Judgment and thought content normal.  Nursing note and vitals reviewed.   Imaging: Ct Angio Head W Or Wo Contrast  Result Date: 03/20/2016 CLINICAL DATA:  65 y/o M; difficulty speaking and right-sided weakness. EXAM: CT ANGIOGRAPHY HEAD AND NECK TECHNIQUE: Multidetector CT imaging of the head and neck was performed using the standard protocol during bolus administration of intravenous contrast. Multiplanar CT image reconstructions and MIPs were obtained to evaluate the vascular anatomy. Carotid stenosis measurements (when applicable) are obtained utilizing NASCET criteria, using the distal internal carotid diameter as the denominator. CONTRAST:  50 cc Isovue 370  COMPARISON:  03/20/2016 CT head. FINDINGS: CTA NECK FINDINGS Aortic arch: 3 vessel arch. No significant stenosis of great vessel origins. Mild atherosclerosis with calcifications. Right carotid system: No evidence of dissection, stenosis (50% or greater) or occlusion. Mild calcified plaque at the bifurcation. Left carotid system: No evidence of dissection, stenosis (50% or greater) or occlusion. Mild calcified plaque at the bifurcation. Vertebral arteries: Codominant. No evidence of dissection, stenosis (50% or greater) or occlusion. Skeleton: Multilevel advanced cervical spondylosis with extensive discogenic degenerative changes and uncovertebral hypertrophy. There is canal stenosis throughout the cervical spine severe at the C4-5 and C5-6 levels. Additionally, there is multilevel moderate to severe bony foraminal narrowing. Other neck: No mass or inflammatory process is identified. Upper chest: Negative. Review of the MIP images confirms the above findings CTA HEAD FINDINGS Anterior circulation: Occlusion of left A2 segment just downstream to frontal polar artery origin with collateral reconstitution at the proximal A3 segment (series 406, image 15). Left M2 inferior division proximal occlusion. There is poor collateralization in the left posterior MCA territory (series 401, image 144). Otherwise there is no proximal vessel occlusion, high-grade stenosis, or aneurysm of the anterior circulation. Posterior circulation: Right V4 focal fusiform aneurysm measuring 5 mm (series 401, image 101). Otherwise there is no high-grade stenosis, aneurysm, or occlusion of the posterior circulation. Venous sinuses: Poor contrast opacification. Anatomic variants: Diminutive left posterior communicating artery. No right posterior communicating artery identified, likely hypoplastic or absent. Small anterior communicating artery Review of the MIP images confirms the above findings IMPRESSION: 1. Left M2 inferior division proximal  occlusion. Poor collateralization of the posterior left MCA territory. 2. Left distal A2 to proximal A3 segment occlusion. 3. 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 4. Otherwise there is no proximal vessel occlusion, high-grade stenosis, or aneurysm of the circle of Willis. 5. Carotid and vertebral arteries of the neck are widely patent. 6. Advanced cervical spondylosis with severe canal stenosis at the C4-5 and C5-6 levels. These results were called by telephone at the time of interpretation on 03/20/2016 at 5:39 am to Dr. Rhona Leavens , who verbally acknowledged these results. Electronically Signed   By: Mitzi Hansen M.D.   On: 03/20/2016 05:50   Ct Head Wo Contrast  Result Date: 03/20/2016 CLINICAL DATA:  Left MCA and ACA thrombus. Status post endovascular therapy. EXAM: CT HEAD WITHOUT CONTRAST TECHNIQUE: Contiguous axial images were obtained from the base of the skull through the vertex without intravenous contrast. COMPARISON:  Earlier today, most recently at 4:50 a.m. FINDINGS: Brain: Gray-white blurring in the left parietal region, correlating with infarct core seen on previous cerebral perfusion. Infarct is also seen in the inferior posterior left insula. No hemorrhagic conversion. No shift or hydrocephalus. Vascular: Symmetric vessel density. Skull: Negative Sinuses/Orbits: Negative IMPRESSION: 1. Acute infarct now visible in the left parietal and posterior insular regions, correlating with infarct core on prior CT perfusion. 2. No hemorrhagic conversion. Electronically Signed   By: Marnee Spring M.D.   On: 03/20/2016 09:50   Ct Angio Neck W Or Wo Contrast  Result Date: 03/20/2016 CLINICAL DATA:  65 y/o M; difficulty speaking and right-sided weakness. EXAM: CT ANGIOGRAPHY HEAD AND NECK TECHNIQUE: Multidetector CT imaging of the head and neck was performed using the standard protocol during bolus administration of intravenous contrast. Multiplanar CT image reconstructions and  MIPs were obtained to evaluate the vascular anatomy. Carotid stenosis measurements (when applicable) are obtained utilizing NASCET criteria, using the distal internal carotid diameter as the denominator. CONTRAST:  50 cc Isovue 370 COMPARISON:  03/20/2016 CT head. FINDINGS: CTA NECK FINDINGS Aortic arch: 3 vessel arch. No significant stenosis of great vessel origins. Mild atherosclerosis with calcifications. Right carotid system: No evidence of dissection, stenosis (50% or greater) or occlusion. Mild calcified plaque at the bifurcation. Left carotid system: No evidence of dissection, stenosis (50% or greater) or occlusion. Mild calcified plaque at the bifurcation. Vertebral arteries: Codominant. No evidence of dissection, stenosis (50% or greater) or occlusion. Skeleton: Multilevel advanced cervical spondylosis with extensive discogenic degenerative changes and uncovertebral hypertrophy. There is canal stenosis throughout the cervical spine severe at the C4-5 and C5-6 levels. Additionally, there is multilevel moderate to severe bony foraminal narrowing. Other neck: No mass or inflammatory process is identified. Upper chest: Negative. Review of the MIP images confirms the above findings CTA HEAD FINDINGS Anterior circulation: Occlusion of left A2 segment just downstream to frontal polar artery origin with collateral reconstitution at the proximal A3 segment (series 406, image 15). Left M2 inferior division proximal occlusion. There is poor collateralization in the left posterior MCA territory (series 401, image 144). Otherwise there is no proximal vessel occlusion, high-grade stenosis, or aneurysm of the anterior circulation. Posterior circulation: Right V4 focal fusiform aneurysm measuring 5 mm (series 401, image 101). Otherwise there is no high-grade stenosis, aneurysm, or occlusion of the posterior circulation. Venous sinuses: Poor contrast opacification. Anatomic variants: Diminutive left posterior communicating  artery. No right posterior communicating artery identified, likely hypoplastic or absent. Small anterior communicating artery Review of the MIP images confirms the above findings IMPRESSION: 1.  Left M2 inferior division proximal occlusion. Poor collateralization of the posterior left MCA territory. 2. Left distal A2 to proximal A3 segment occlusion. 3. 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 4. Otherwise there is no proximal vessel occlusion, high-grade stenosis, or aneurysm of the circle of Willis. 5. Carotid and vertebral arteries of the neck are widely patent. 6. Advanced cervical spondylosis with severe canal stenosis at the C4-5 and C5-6 levels. These results were called by telephone at the time of interpretation on 03/20/2016 at 5:39 am to Dr. Rhona LeavensIMOTHY OSTER , who verbally acknowledged these results. Electronically Signed   By: Mitzi HansenLance  Furusawa-Stratton M.D.   On: 03/20/2016 05:50   Ct Cervical Spine Wo Contrast  Result Date: 03/20/2016 CLINICAL DATA:  65 y/o  M; unwitnessed fall. EXAM: CT CERVICAL SPINE WITHOUT CONTRAST TECHNIQUE: Multidetector CT imaging of the cervical spine was performed without intravenous contrast. Multiplanar CT image reconstructions were also generated. COMPARISON:  None. FINDINGS: Alignment: Normal cervical lordosis.  No listhesis. Skull base and vertebrae: No acute fracture. No primary bone lesion or focal pathologic process. Soft tissues and spinal canal: No prevertebral fluid or swelling. No visible canal hematoma. Disc levels: There is multilevel advanced cervical spondylosis with extensive discogenic changes and uncovertebral hypertrophy throughout the cervical spine there is multilevel canal stenosis that appears severe at the C4-5 and C5-6 levels. Additionally, there is multilevel moderate to severe bony foraminal narrowing. Upper chest: Negative. Other: No mass or inflammatory process is identified on this noncontrast examination. IMPRESSION: 1. No acute fracture or  dislocation of the cervical spine. 2. Advanced cervical spondylosis with multilevel canal stenosis that appears severe at the C4 through C6 levels and mild-to-moderate foraminal narrowing. Electronically Signed   By: Mitzi HansenLance  Furusawa-Stratton M.D.   On: 03/20/2016 05:13   Mr Brain Wo Contrast  Result Date: 03/21/2016 CLINICAL DATA:  65 y/o M; status post left ACA and MCA occlusion post intra-arterial revascularization. EXAM: MRI HEAD WITHOUT CONTRAST TECHNIQUE: Multiplanar, multiecho pulse sequences of the brain and surrounding structures were obtained without intravenous contrast. COMPARISON:  03/20/2016 CT head, CT perfusion, and CT angiogram. FINDINGS: Brain: Areas of diffusion restriction within the left posterior insula, left lateral parietal lobe extending into operculum, and a few additional scattered punctate foci throughout the left MCA distribution. Single small focus of diffusion restriction within the left mid cingulate gyrus. Areas of infarction demonstrate T2 FLAIR hyperintensity. There is an additional background of scattered foci of T2 FLAIR hyperintensity in subcortical and periventricular white matter compatible with mild chronic microvascular ischemic changes. Mild parenchymal volume loss. No abnormal susceptibility hypointensity of the brain parenchyma to suggest intracranial hemorrhage. Vascular: No signal abnormality of central flow voids. Skull and upper cervical spine: Normal marrow signal. Sinuses/Orbits: Moderate diffuse paranasal sinus mucosal thickening probably due to intubation. No significant abnormal signal of mastoid air cells. Orbits are unremarkable. Other: None. IMPRESSION: Acute infarct within the left lateral parietal lobe extending into the operculum, additional small foci in the left insula and left mid cingulate gyrus, and scattered punctate foci and left MCA distribution. No evidence for hemorrhagic conversion. Electronically Signed   By: Mitzi HansenLance  Furusawa-Stratton M.D.    On: 03/21/2016 06:22   Ct Cerebral Perfusion W Contrast  Result Date: 03/20/2016 CLINICAL DATA:  65 y/o  M; right-sided weakness. EXAM: CT CEREBRAL PERFUSION WITH CONTRAST TECHNIQUE: CT was perfusion was performed with automatic post processing by RAPID software. CONTRAST:  40 cc Isovue 370 COMPARISON:  CT angiogram head and neck 03/20/2016 per FINDINGS:  Good quality arterial input and venous outflow functions. Mild patient motion. CBF less than 30% volume:  29 mL T-max greater than 6 seconds volume: 146 mL Mismatch volume: 117 mL Mismatch ratio 5.0 Cord ischemia is present in the left posterior MCA distribution. Ischemic penumbra is present in both the left ACA and left MCA distributions. No large subacute infarct on noncontrast head CT. IMPRESSION: 1. Calculated core infarct volume: 29 mL, present in the left posterior MCA distribution. 2. Calculated ischemic penumbra volume: 146 mL, is present in the left MCA and left ACA distributions. 3. Calculated mismatch volume: 117 mL and Mismatch ratio 5.0. 4. No large subacute infarct on noncontrast head CT is present to complicate calculations. Electronically Signed   By: Mitzi Hansen M.D.   On: 03/20/2016 06:43   Dg Chest Port 1 View  Result Date: 03/21/2016 CLINICAL DATA:  Ventilator dependence. EXAM: PORTABLE CHEST 1 VIEW COMPARISON:  03/20/2016. FINDINGS: 0645 hours. Endotracheal tube tip is approximately 4.6 cm above the base of the carina. Lung volumes remain low in this patient rotated to the right. There is some persistent left base streaky opacity, likely atelectatic. Right lung is clear. Cardiopericardial silhouette is at upper limits of normal for size. The visualized bony structures of the thorax are intact. Telemetry leads overlie the chest. IMPRESSION: Stable exam.  Low volume film with left base atelectasis. Electronically Signed   By: Kennith Center M.D.   On: 03/21/2016 09:13   Dg Chest Port 1 View  Result Date:  03/20/2016 CLINICAL DATA:  65 year old male intubated. Right-sided weakness and possible stroke. Initial encounter. EXAM: PORTABLE CHEST 1 VIEW COMPARISON:  None. FINDINGS: Endotracheal tube tip midline. Carina difficult adequately assess although I suspect the tip of the endotracheal tube is 3 cm above the carina. Prominence of the mediastinum. This may be related to technique and poor inspiration but will need to be evaluated on follow-up with better inspiration. Pulmonary vascular prominence most notable centrally. Heart size within normal limits. IMPRESSION: Endotracheal tube tip midline 3 cm above the carina. Prominence of the mediastinum. This may be related to technique and poor inspiration but will need to be evaluated on follow-up with better inspiration. Pulmonary vascular prominence most notable centrally. Electronically Signed   By: Lacy Duverney M.D.   On: 03/20/2016 11:45   Ct Head Code Stroke W/o Cm  Result Date: 03/20/2016 CLINICAL DATA:  Code stroke. 65 y/o M; unwitnessed fall with dysphasia. EXAM: CT HEAD WITHOUT CONTRAST TECHNIQUE: Contiguous axial images were obtained from the base of the skull through the vertex without intravenous contrast. COMPARISON:  None. FINDINGS: Brain: No evidence of acute infarction, hemorrhage, hydrocephalus, extra-axial collection or mass lesion/mass effect. Background of mild to moderate parenchymal volume loss and chronic microvascular ischemic changes. Vascular: Dense left M1 segment best appreciated on the coronal sequence (series 4, image 41). Skull: Normal. Negative for fracture or focal lesion. Sinuses/Orbits: No acute finding. Other: None. ASPECTS [facility] Stroke Program Early CT Score) - Ganglionic level infarction (caudate, lentiform nuclei, internal capsule, insula, M1-M3 cortex): 7 - Supraganglionic infarction (M4-M6 cortex): 3 Total score (0-10 with 10 being normal): 10 IMPRESSION: 1. No acute intracranial abnormality is identified. 2. Dense left M1  may represent thrombus. 3. ASPECTS is 10 These results were called by telephone at the time of interpretation on 03/20/2016 at 5:07 am to Dr. Tomasita Crumble , who verbally acknowledged these results. Electronically Signed   By: Mitzi Hansen M.D.   On: 03/20/2016 05:09    Labs:  CBC:  Recent Labs  03/20/16 0437 03/20/16 0442 03/21/16 0500 03/22/16 0609  WBC 7.3  --  5.3 6.4  HGB 14.0 14.3 11.0* 11.2*  HCT 43.1 42.0 32.9* 34.6*  PLT 208  --  139* 134*    COAGS:  Recent Labs  03/20/16 0437  INR 1.01  APTT 27    BMP:  Recent Labs  03/20/16 0437 03/20/16 0442 03/21/16 0500 03/22/16 0609  NA 137 139 138 139  K 3.9 3.9 3.3* 3.7  CL 104 104 110 109  CO2 23  --  22 27  GLUCOSE 104* 101* 99 107*  BUN 19 24* 8 9  CALCIUM 9.2  --  8.0* 8.0*  CREATININE 0.98 0.90 0.76 0.78  GFRNONAA >60  --  >60 >60  GFRAA >60  --  >60 >60    LIVER FUNCTION TESTS:  Recent Labs  03/20/16 0437  BILITOT 0.4  AST 27  ALT 21  ALKPHOS 63  PROT 6.5  ALBUMIN 4.1    Assessment and Plan:  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

## 2016-03-22 NOTE — Progress Notes (Signed)
Speech Language Pathology  Patient Details Name: Orma RenderRicky L Garramone MRN: [id number] DOB: 09/24/1950 Today's Date: 03/22/2016 Time:  -     Pt passes RN swallow screen. No swallow assessment needed.    Breck CoonsLisa Willis Beaver MarshLitaker M.Ed [employer] Pager [phone]

## 2016-03-22 NOTE — Progress Notes (Signed)
Received a call from ultrasound that patient has a DVT in his left leg, MD  Paged to notify.

## 2016-03-22 NOTE — Evaluation (Signed)
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  Pt also found to have L DVT of indeterminate age and MD stated no movement restrictions.  PMH includes multiple hip surgeries from childhood and one in 90's for THA.   Clinical Impression   Pt admitted with the above diagnoses and presents with below problem list. Pt will benefit from continued acute OT to address the below listed deficits and maximize independence with basic ADLs prior to d/c home. PTA pt was independent with ADLs. Pt is currently min guard for most ADLs. Pt reporting decreased ability to control movements of RUE. Noted communication vs. cognition? deficits. Session details below.     Follow Up Recommendations  Home health OT;Supervision/Assistance - 24 hour    Equipment Recommendations  3 in 1 bedside comode    Recommendations for Other Services       Precautions / Restrictions Precautions Precautions: Fall Required Braces or Orthoses: Other Brace/Splint Other Brace/Splint: normally wears an insert in his shoe to assist with limp Restrictions Weight Bearing Restrictions: No      Mobility Bed Mobility Overal bed mobility: Needs Assistance Bed Mobility: Supine to Sit     Supine to sit: Supervision     General bed mobility comments: up in chair  Transfers Overall transfer level: Needs assistance   Transfers: Sit to/from Stand Sit to Stand: Supervision         General transfer comment: S for safety    Balance Overall balance  assessment: Needs assistance   Sitting balance-Leahy Scale: Good     Standing balance support: Bilateral upper extremity supported;No upper extremity supported Standing balance-Leahy Scale: Fair                              ADL Overall ADL's : Needs assistance/impaired Eating/Feeding: Set up;Sitting   Grooming: Set up;Sitting Grooming Details (indicate cue type and reason): extra time Upper Body Bathing: Set up;Sitting;Min guard   Lower Body Bathing: Min guard;Sit to/from stand   Upper Body Dressing : Set up;Min guard;Sitting   Lower Body Dressing: Min guard;Sit to/from stand   Toilet Transfer: Ambulation;Min guard;RW   Toileting- ArchitectClothing Manipulation and Hygiene: Set up;Min guard;Sitting/lateral lean;Sit to/from stand   Tub/ Shower Transfer: Min guard;Ambulation;3 in 1;Rolling walker   Functional mobility during ADLs: Min guard;Rolling walker General ADL Comments: Pt ambulated around the bed and to recliner in room. Pt min guard OOB. Some proprioceptive difficulties in RUE. Some communication vs cognitive? deficits noted. Spouse present. Reported difficulty writing.      Vision Vision Assessment?: No apparent visual deficits   Perception     Praxis      Pertinent Vitals/Pain Pain Assessment: No/denies pain     Hand Dominance Right   Extremity/Trunk Assessment Upper Extremity Assessment Upper Extremity Assessment: RUE deficits/detail RUE Deficits / Details: WFL AROM and strength. Pt reports difficulty controlling movements of RUE at times indicating some decreased proprioception. Difficulty controlling pen to write. Issued  built-up foam for pen. Pt feeding self with right hand  at end of session. Able to twist open caps using R hand.  RUE Coordination: decreased gross motor;decreased fine motor   Lower Extremity Assessment Lower Extremity Assessment: Defer to PT evaluation       Communication Communication Communication: No difficulties    Cognition Arousal/Alertness: Awake/alert Behavior During Therapy: WFL for tasks assessed/performed;Flat affect Overall Cognitive Status: Impaired/Different from baseline Area of Impairment: Problem solving;Memory;Attention   Current Attention Level: Selective Memory: Decreased short-term memory     Awareness: Anticipatory Problem Solving: Slow processing General Comments: Pt oriented, but some confusion as to what side had been weak after CVA. Flat affect.   General Comments       Exercises Exercises: Other exercises Other Exercises Other Exercises: educated on chair raises as good propioceptive exercise for RUE. Discussed using RUE functionally and RUE ROM exercises with light resistance (i.e water bottle).    Shoulder Instructions      Home Living Family/patient expects to be discharged to:: Private residence Living Arrangements: Spouse/significant other Available Help at Discharge: Family Type of Home: House Home Access: Stairs to enter [employer] of Steps: 5 Entrance Stairs-Rails: Left Home Layout: Two level;Able to live on main level with bedroom/bathroom;Laundry or work area in Artist of Steps: 14 Alternate Level Stairs-Rails: Right Bathroom Shower/Tub: Chief Strategy Officer: Standard     Home Equipment: [employer] - single point   Additional Comments: Wife reports "there are stairs everywhere!"  Lives With: Spouse    Prior Functioning/Environment Level of Independence: Independent        Comments: R hip replacement with bone graft in 90's and pt reports he has walked with a limp        OT Problem List: Impaired balance (sitting and/or standing);Decreased coordination;Decreased knowledge of use of DME or AE;Decreased knowledge of precautions;Impaired UE functional use   OT Treatment/Interventions: Self-care/ADL training;Therapeutic exercise;DME and/or AE instruction;Neuromuscular education;Therapeutic  activities;Patient/family education;Balance training;Cognitive remediation/compensation    OT Goals(Current goals can be found in the care plan section) Acute Rehab OT Goals Patient Stated Goal: go home OT Goal Formulation: With patient/family Time For Goal Achievement: 04/05/16 Potential to Achieve Goals: Good ADL Goals Pt Will Perform Grooming: standing;sitting;with modified independence Pt Will Perform Upper Body Bathing: with modified independence;sitting Pt Will Perform Upper Body Dressing: with modified independence;sitting Pt Will Transfer to Toilet: with modified independence;ambulating Pt Will Perform Toileting - Clothing Manipulation and hygiene: with modified independence;sit to/from stand Pt Will Perform Tub/Shower Transfer: Tub transfer;with supervision;ambulating;3 in 1;rolling walker Pt/caregiver will Perform Home Exercise Program: Right Upper extremity;With theraband (for increased propioception)  OT Frequency: Min 2X/week   Barriers to D/C:            Co-evaluation              End of Session Equipment Utilized During Treatment: Gait belt;Rolling walker Nurse Communication: Other (comment) (Pt up in chair at end of session.)  Activity Tolerance: Patient tolerated treatment well Patient left: in chair;with call bell/phone within reach;with chair alarm set;with family/visitor present   Time: 1206-1230 OT Time Calculation (min): 24 min Charges:  OT General Charges $OT Visit: 1 Procedure OT Evaluation $OT Eval Low Complexity: 1 Procedure OT Treatments $Self Care/Home Management : 8-22 mins G-Codes:    Pilar Grammes 2016/04/03, 12:57 PM

## 2016-03-22 NOTE — Evaluation (Signed)
Speech Language Pathology Evaluation Patient Details Name: Jermaine Burns MRN: [id number] DOB: 08/20/1950 Today's Date: 03/22/2016 Time:  -     Problem List:  Patient Active Problem List   Diagnosis Date Noted  . Acute ischemic stroke (HCC) 03/20/2016   Past Medical History:  Past Medical History:  Diagnosis Date  . Hypercholesterolemia    Past Surgical History:  Past Surgical History:  Procedure Laterality Date  . JOINT REPLACEMENT     left hip  . RADIOLOGY WITH ANESTHESIA N/A 03/20/2016   Procedure: RADIOLOGY WITH ANESTHESIA;  Surgeon: Julieanne CottonSanjeev Deveshwar, MD;  Location: [facility] OR;  Service: Radiology;  Laterality: N/A;   HPI:  89-yo man with PMH: hypercholesterolemia, right hip fx. 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 MRI aute infarct within the left lateral parietal lobe extending into the operculum, additional small foci in the left insula and left mid cingulate gyrus, and scattered punctate foci    Assessment / Plan / Recommendation Clinical Impression  In comparison to initial presentation (per chart) and pt/wife report, pt's language has dramatically improved. He is exhibiting mild transient impairments with higher level/abstract language, occassional dysnomia, dysgraphia and required max-total assist with simple calculations. Pt is employed as a RT at [employer] and is uncertain re: plans for work. He would benefit from continued ST to increase independence for complex ADL's and return to work if relevent.     SLP Assessment  Patient needs continued Speech Lanaguage Pathology Services    Follow Up Recommendations  Outpatient SLP    Frequency and Duration min 2x/week  2 weeks      SLP Evaluation Cognition  Overall Cognitive Status: Impaired/Different from baseline Arousal/Alertness: Awake/alert Orientation Level: Oriented  X4 Attention: Sustained Sustained Attention: Appears intact Memory: Appears intact (9 on Cognistat WFL, almost 65 yr old) Awareness: Appears intact Problem Solving: Appears intact Executive Function: Self Monitoring;Organizing Organizing: Impaired Self Monitoring: Impaired Safety/Judgment: Appears intact       Comprehension  Auditory Comprehension Overall Auditory Comprehension: Impaired (mild difficulty with abstract comprehension) Yes/No Questions: Within Functional Limits Commands: Impaired Complex Commands: 75-100% accurate Visual Recognition/Discrimination Discrimination: Not tested Reading Comprehension Reading Status: Impaired (comprehended paragraph; difficulty word recognition intermit) Word level: Within functional limits Sentence Level: Within functional limits Paragraph Level: Impaired    Expression Expression Primary Mode of Expression: Verbal Verbal Expression Overall Verbal Expression: Impaired (infrequent dysnomia) Initiation: No impairment Level of Generative/Spontaneous Verbalization: Conversation Repetition:  (required repetition of sentence) Naming: No impairment Pragmatics: No impairment Written Expression Dominant Hand: Right Written Expression: Exceptions to [facility] AWFL Self Formulation Ability: Sentence   Oral / Motor  Oral Motor/Sensory Function Overall Oral Motor/Sensory Function: Within functional limits Motor Speech Overall Motor Speech: Appears within functional limits for tasks assessed (fast rate of speech is baseline per wife) Respiration: Within functional limits Phonation: Normal Resonance: Within functional limits Articulation: Within functional limitis Intelligibility: Intelligibility reduced Word: 75-100% accurate Phrase: 75-100% accurate Sentence: 75-100% accurate Conversation: 75-100% accurate (per wife increased rate of speech baseline) Motor Planning: Witnin functional limits   GO                    Jermaine Burns, Jermaine Wrede  Burns 03/22/2016, 3:28 PM  Jermaine Burns Jermaine Burns [employer]-SLP Pager [phone]

## 2016-03-22 NOTE — Progress Notes (Signed)
VASCULAR LAB PRELIMINARY  PRELIMINARY  PRELIMINARY  PRELIMINARY  Bilateral lower extremity venous duplex completed.    Preliminary report:  There is age indeterminate DVT noted in the left femoral, popliteal, and posterior tibial veins.    Called report to [place], RN  [facility] LPKANADY, Charlye Spare, RVT 03/22/2016, 8:52 AM

## 2016-03-22 NOTE — Evaluation (Signed)
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  Pt also found to have L DVT of indeterminate age and MD stated no movement restrictions.  PMH includes multiple hip surgeries from childhood and one in 90's for THA.  Clinical Impression  Pt admitted with above diagnosis. Pt currently with functional limitations due to the deficits listed below (see PT Problem List).  Pt will benefit from skilled PT to increase their independence and safety with mobility to allow discharge to the venue listed below.  Pt with flat affect throughout session, but did well with RW and recommend one for home.  Pt with steps throughout home and feel he would benefit from home safety evaluation to assess him in his home environment.  PT also asked pt's wife to bring his shoe with insert for next session.    Follow Up Recommendations Home health PT;Supervision for mobility/OOB    Equipment Recommendations  Rolling walker with 5" wheels    Recommendations for Other Services       Precautions / Restrictions Precautions Precautions: Fall Required Braces or Orthoses: Other Brace/Splint Other Brace/Splint: normally wears an insert in his shoe to assist with limp Restrictions Weight Bearing Restrictions: No      Mobility  Bed Mobility Overal bed mobility: Needs Assistance Bed Mobility: Supine to Sit     Supine to sit: Supervision     General bed mobility comments: bed flat with S and difficulty  Transfers Overall transfer level: Needs assistance   Transfers: Sit to/from Stand Sit to Stand: Supervision         General transfer comment: S for safety  Ambulation/Gait Ambulation/Gait assistance: Min guard Ambulation Distance (Feet): 225 Feet Assistive device: Rolling walker (2 wheeled);None Gait Pattern/deviations: Decreased step length - right;Wide base of support;Decreased weight shift to left     General Gait Details: Pt with limp and large UE sway to the R when ambulating without RW.  Wife and pt report this is close to his prior level, but not quite. Steadier with RW with limp still present with R hip circumduction.  Stairs Stairs: Yes Stairs assistance: Min guard Stair Management: One rail Left;Two rails;Step to pattern Number of Stairs: 10 General stair comments: Step to pattern with MIN/guard and forward flexion noted with descent.  Wheelchair Mobility    Modified Rankin (Stroke Patients Only) Modified Rankin (Stroke Patients Only) Pre-Morbid Rankin Score: No symptoms Modified Rankin: Moderate disability     Balance Overall balance assessment: Needs assistance   Sitting balance-Leahy Scale: Good       Standing balance-Leahy Scale: Fair                               Pertinent Vitals/Pain Pain Assessment: No/denies pain    Home Living Family/patient expects to be discharged to:: Private residence Living Arrangements: Spouse/significant other Available Help at Discharge: Family Type of Home: House Home Access: Stairs to enter Entrance Stairs-Rails: Left Entrance Stairs-Number of Steps: 5 Home Layout: Two level;Able to live on main level with bedroom/bathroom;Laundry  or work area in basement Home Equipment: [employer] - single point Additional Comments: Wife reports "there are stairs everywhere!"    Prior Function Level of Independence: Independent         Comments: R hip replacement with bone graft in 90's and pt reports he has walked with a limp     Hand Dominance   Dominant Hand:  Right    Extremity/Trunk Assessment   Upper Extremity Assessment: Defer to OT evaluation           Lower Extremity Assessment: Overall WFL for tasks assessed (no difference with MMTs R vs. L)         Communication   Communication: No difficulties  Cognition Arousal/Alertness: Awake/alert Behavior During Therapy: Flat affect Overall Cognitive Status: Impaired/Different from baseline Area of Impairment: Problem solving;Memory;Attention     Memory: Decreased short-term memory     Awareness: Anticipatory Problem Solving: Slow processing General Comments: Pt oriented, but some confusion as to what side had been weak after CVA. Flat affect.    General Comments      Exercises     Assessment/Plan    PT Assessment Patient needs continued PT services  PT Problem List Decreased balance;Decreased safety awareness;Decreased mobility          PT Treatment Interventions DME instruction;Gait training;Stair training;Functional mobility training;Balance training;Therapeutic exercise;Therapeutic activities;Patient/family education    PT Goals (Current goals can be found in the Care Plan section)  Acute Rehab PT Goals Patient Stated Goal: go home PT Goal Formulation: With patient/family Time For Goal Achievement: 03/29/16 Potential to Achieve Goals: Good    Frequency Min 4X/week   Barriers to discharge Inaccessible home environment      Co-evaluation               End of Session Equipment Utilized During Treatment: Gait belt Activity Tolerance: Patient tolerated treatment well Patient left: in chair;with call bell/phone within reach;with family/visitor present;with chair alarm set Nurse Communication: Mobility status         Time: 9604-54091032-1103 PT Time Calculation (min) (ACUTE ONLY): 31 min   Charges:   PT Evaluation $PT Eval Moderate Complexity: 1 Procedure PT Treatments $Gait Training: 8-22 mins   PT G Codes:        Jermaine Burns 03/22/2016,  11:25 AM

## 2016-03-22 NOTE — Progress Notes (Signed)
MD called back and said not to restrict movement at this time.

## 2016-03-22 NOTE — Progress Notes (Signed)
PT Cancellation Note  Patient Details Name: Orma RenderRicky L Donlan MRN: [id number] DOB: 05/24/1951   Cancelled Treatment:    Reason Eval/Treat Not Completed: Patient at procedure or test/unavailable. Will check back as schedule permits.   Jeslyn Amsler LUBECK 03/22/2016, 8:48 AM

## 2016-03-22 NOTE — Care Management Note (Signed)
Case Management Note  Patient Details  Name: Orma RenderRicky L Merkin MRN: [id number] Date of Birth: 02/12/1951  Subjective/Objective:                    Action/Plan: [facility] CM following for d/c needs.  Expected Discharge Date:                  Expected Discharge Plan:  IP Rehab Facility  In-House Referral:     Discharge planning Services  CM Consult  Post Acute Care Choice:    Choice offered to:     DME Arranged:    DME Agency:     HH Arranged:    HH Agency:     Status of Service:  In process, will continue to follow  If discussed at Long Length of Stay Meetings, dates discussed:    Additional Comments:  Kermit BaloKelli F Mikayla Chiusano, RN 03/22/2016, 1:33 PM

## 2016-03-23 ENCOUNTER — Encounter (HOSPITAL_COMMUNITY): Payer: Self-pay | Admitting: Interventional Radiology

## 2016-03-23 DIAGNOSIS — I63512 Cerebral infarction due to unspecified occlusion or stenosis of left middle cerebral artery: Principal | ICD-10-CM

## 2016-03-23 LAB — BASIC METABOLIC PANEL
Anion gap: 7 (ref 5–15)
BUN: 10 mg/dL (ref 6–20)
CALCIUM: 8.6 mg/dL — AB (ref 8.9–10.3)
CO2: 26 mmol/L (ref 22–32)
CREATININE: 0.81 mg/dL (ref 0.61–1.24)
Chloride: 106 mmol/L (ref 101–111)
GFR calc Af Amer: >60 mL/min (ref >60)
GLUCOSE: 99 mg/dL (ref 65–99)
Potassium: 3.8 mmol/L (ref 3.5–5.1)
Sodium: 139 mmol/L (ref 135–145)

## 2016-03-23 LAB — CBC
HCT: 34.9 % — ABNORMAL LOW (ref 39.0–52.0)
Hemoglobin: 11.4 g/dL — ABNORMAL LOW (ref 13.0–17.0)
MCH: 30.6 pg (ref 26.0–34.0)
MCHC: 32.7 g/dL (ref 30.0–36.0)
MCV: 93.6 fL (ref 78.0–100.0)
PLATELETS: 145 10*3/uL — AB (ref 150–400)
RBC: 3.73 MIL/uL — ABNORMAL LOW (ref 4.22–5.81)
RDW: 12.7 % (ref 11.5–15.5)
WBC: 5.8 10*3/uL (ref 4.0–10.5)

## 2016-03-23 MED ORDER — ATORVASTATIN CALCIUM 20 MG PO TABS: 20.0000 mg | ORAL_TABLET | Freq: Every day | ORAL | 5 refills | Status: AC

## 2016-03-23 MED ORDER — ASPIRIN 325 MG PO TBEC: 325.0000 mg | DELAYED_RELEASE_TABLET | Freq: Every day | ORAL | 0 refills | Status: AC

## 2016-03-23 MED ORDER — ACETAMINOPHEN 325 MG PO TABS: 650.0000 mg | ORAL_TABLET | Freq: Four times a day (QID) | ORAL | Status: AC | PRN

## 2016-03-23 NOTE — Progress Notes (Signed)
Patient ID: Jermaine RenderRicky L Jeffcoat, male   DOB: 12/07/1950, 65 y.o.   MRN: [id number] INR  Reference Brief OP report of 03/20/2016.. Addendum. Patient received  5 mg of superselective INTRA-ARTERIAL TPA . Fatima SangerS Kellsey Sansone MD

## 2016-03-23 NOTE — Progress Notes (Addendum)
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  Pt also found to have L DVT of indeterminate age and MD stated no movement restrictions.  PMH includes multiple hip surgeries from childhood and one in 90's for THA.    PT Comments    Pt NPO this AM awaiting TEE. Possible d/c home today, depending results of TEE.   Follow Up Recommendations  Home health PT;Supervision for mobility/OOB     Equipment Recommendations  Rolling walker with 5" wheels    Recommendations for Other Services       Precautions / Restrictions Precautions Precautions: Fall Required Braces or Orthoses: Other Brace/Splint Other Brace/Splint: normally wears an insert in his shoe to assist with limp Restrictions Weight Bearing Restrictions: No    Mobility  Bed Mobility         Supine to sit: Supervision;HOB elevated     General bed mobility comments: increased time required for motor planning to complete at supervision level  Transfers   Equipment used: Ambulation equipment used   Sit to Stand: Supervision         General transfer comment: LOB with initial standing but able to self correct  Ambulation/Gait Ambulation/Gait assistance: Min guard Ambulation Distance (Feet): 350 Feet Assistive device: Rolling walker (2 wheeled) Gait Pattern/deviations: Decreased step length - right   Gait velocity interpretation: at or above normal speed for age/gender General Gait Details: R limp mildly present during ambulation with RW. Pt's limp  pre-existing due to hip issues. Ambulated in his sneakers today with insert in R shoe.   Stairs            Wheelchair Mobility    Modified Rankin (Stroke Patients Only) Modified Rankin (Stroke Patients Only) Pre-Morbid Rankin Score: No symptoms Modified Rankin: Moderate disability     Balance Overall balance assessment: Needs assistance Sitting-balance support: Feet supported;No upper extremity supported Sitting balance-Leahy Scale: Good     Standing balance support: Bilateral upper extremity supported;During functional activity Standing balance-Leahy Scale: Fair                      Cognition Arousal/Alertness: Awake/alert Behavior During Therapy: Flat affect Overall Cognitive Status: Impaired/Different from baseline Area of Impairment: Problem solving;Attention;Memory   Current Attention Level: Selective Memory: Decreased short-term memory     Awareness: Anticipatory Problem Solving: Slow processing;Requires verbal cues;Difficulty sequencing General Comments: Mild deficits noted in higher level language/cognitive skills.    Exercises      General Comments        Pertinent Vitals/Pain Pain Assessment: No/denies pain    Home Living                      Prior Function            PT Goals (current goals can now be found in the care plan section) Acute Rehab PT Goals Patient Stated Goal: go home PT Goal Formulation: With patient/family Time For Goal Achievement: 03/29/16 Potential to Achieve Goals: Good Progress towards PT goals: Progressing toward goals    Frequency  Min 4X/week      PT Plan Current plan remains appropriate    Co-evaluation             End of Session Equipment Utilized During Treatment: Gait belt Activity Tolerance: Patient tolerated treatment well Patient left: in chair;with call bell/phone within reach;with family/visitor present (Pt declining chair alarm. Wife present in room and reports she  will remain in room.)     Time: 1042-1107 PT Time Calculation (min) (ACUTE ONLY): 25 min  Charges:  $Gait Training: 8-22 mins $Therapeutic Activity: 8-22 mins                    G Codes:      Ilda Foil 03/23/2016, 12:08 PM

## 2016-03-23 NOTE — Discharge Summary (Signed)
Stroke Discharge Summary  Patient ID: Jermaine Burns   MRN: [id number]      DOB: 10/02/1950  Date of Admission: 03/20/2016 Date of Discharge: 03/23/2016  Attending Physician:  Rosalin Hawking, MD, Stroke MD Consulting Physician(s):    pulmonary/intensive care  Marshell Garfinkel MD Patient's PCP:  No primary care provider on file.  DISCHARGE DIAGNOSIS:   Active Problems:   Acute ischemic stroke (HCC)    Left MCA infarct s/p mechanical thrombectomy with TICI 3 at left M2    HLD     BMI: Body mass index is 29.26 kg/m.  Past Medical History:  Diagnosis Date  . Hypercholesterolemia    Past Surgical History:  Procedure Laterality Date  . IR GENERIC HISTORICAL  03/20/2016   IR ANGIO VERTEBRAL SEL SUBCLAVIAN INNOMINATE BILAT MOD SED 03/20/2016 Luanne Bras, MD [facility]  . IR GENERIC HISTORICAL  03/20/2016   IR PERCUTANEOUS ART THROMBECTOMY/INFUSION INTRACRANIAL INC DIAG ANGIO 03/20/2016 Luanne Bras, MD [facility]  . IR GENERIC HISTORICAL  03/20/2016   IR ANGIO INTRA EXTRACRAN SEL COM CAROTID INNOMINATE UNI L MOD SED 03/20/2016 Luanne Bras, MD [facility]  . JOINT REPLACEMENT     left hip  . RADIOLOGY WITH ANESTHESIA N/A 03/20/2016   Procedure: RADIOLOGY WITH ANESTHESIA;  Surgeon: Luanne Bras, MD;  Location: [facility];  Service: Radiology;  Laterality: N/A;      Medication List    TAKE these medications   acetaminophen 325 MG tablet Commonly known as:  TYLENOL Take 2 tablets (650 mg total) by mouth every 6 (six) hours as needed for mild pain (temp >/= 99.5).   aspirin 325 MG EC tablet Take 1 tablet (325 mg total) by mouth daily. Start taking on:  03/24/2016   atorvastatin 20 MG tablet Commonly known as:  LIPITOR Take 1 tablet (20 mg total) by mouth daily at 6 PM.   multivitamin with minerals Tabs tablet Take 1 tablet by mouth daily.       LABORATORY STUDIES CBC    Component Value Date/Time   WBC 5.8 03/23/2016 0549   RBC 3.73 (L)  03/23/2016 0549   HGB 11.4 (L) 03/23/2016 0549   HCT 34.9 (L) 03/23/2016 0549   PLT 145 (L) 03/23/2016 0549   MCV 93.6 03/23/2016 0549   MCH 30.6 03/23/2016 0549   MCHC 32.7 03/23/2016 0549   RDW 12.7 03/23/2016 0549   LYMPHSABS 0.6 (L) 03/21/2016 0500   MONOABS 0.5 03/21/2016 0500   EOSABS 0.1 03/21/2016 0500   BASOSABS 0.0 03/21/2016 0500   CMP    Component Value Date/Time   NA 139 03/23/2016 0549   K 3.8 03/23/2016 0549   CL 106 03/23/2016 0549   CO2 26 03/23/2016 0549   GLUCOSE 99 03/23/2016 0549   BUN 10 03/23/2016 0549   CREATININE 0.81 03/23/2016 0549   CALCIUM 8.6 (L) 03/23/2016 0549   PROT 6.5 03/20/2016 0437   ALBUMIN 4.1 03/20/2016 0437   AST 27 03/20/2016 0437   ALT 21 03/20/2016 0437   ALKPHOS 63 03/20/2016 0437   BILITOT 0.4 03/20/2016 0437   GFRNONAA >60 03/23/2016 0549   GFRAA >60 03/23/2016 0549   COAGS Lab Results  Component Value Date   INR 1.01 03/20/2016   Lipid Panel    Component Value Date/Time   CHOL 140 03/21/2016 0500   TRIG 91 03/21/2016 0500   HDL 32 (L) 03/21/2016 0500   CHOLHDL 4.4 03/21/2016 0500   VLDL 18 03/21/2016 0500  [place] 90 03/21/2016 0500   HgbA1C  Lab Results  Component Value Date   HGBA1C 5.2 03/21/2016   Cardiac Panel (last 3 results) No results for input(s): CKTOTAL, CKMB, TROPONINI, RELINDX in the last 72 hours. Urinalysis No results found for: COLORURINE, APPEARANCEUR, LABSPEC, PHURINE, GLUCOSEU, HGBUR, BILIRUBINUR, KETONESUR, PROTEINUR, UROBILINOGEN, NITRITE, LEUKOCYTESUR Urine Drug Screen     Component Value Date/Time   LABOPIA NONE DETECTED 03/20/2016 0501   COCAINSCRNUR NONE DETECTED 03/20/2016 0501   LABBENZ NONE DETECTED 03/20/2016 0501   AMPHETMU NONE DETECTED 03/20/2016 0501   THCU NONE DETECTED 03/20/2016 0501   LABBARB NONE DETECTED 03/20/2016 0501    Alcohol Level    Component Value Date/Time   ETH <5 03/20/2016 0501     SIGNIFICANT DIAGNOSTIC STUDIES I have personally reviewed the  radiological images below and agree with the radiology interpretations.  Ct Head Code Stroke W/o Cm 03/20/2016 1. No acute intracranial abnormality is identified.  2. Dense left M1 may represent thrombus. 3. ASPECTS is 10   Ct Angio Head W Or Wo Contrast Ct Angio Neck W Or Wo Contrast 03/20/2016 1. Left M2 inferior division proximal occlusion. Poor collateralization of the posterior left MCA territory.  2. Left distal A2 to proximal A3 segment occlusion.  3. 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  4. Otherwise there is no proximal vessel occlusion, high-grade stenosis, or aneurysm of the circle of Willis.  5. Carotid and vertebral arteries of the neck are widely patent.  6. Advanced cervical spondylosis with severe canal stenosis at the C4-5 and C5-6 levels.   Ct Cervical Spine Wo Contrast 03/20/2016 1. No acute fracture or dislocation of the cervical spine.  2. Advanced cervical spondylosis with multilevel canal stenosis that appears severe at the C4 through C6 levels and mild-to-moderate foraminal narrowing.   Ct Cerebral Perfusion W Contrast 03/20/2016 1. Calculated core infarct volume: 29 mL, present in the left posterior MCA distribution.  2. Calculated ischemic penumbra volume: 146 mL, is present in the left MCA and left ACA distributions.  3. Calculated mismatch volume: 117 mL and Mismatch ratio 5.0.  4. No large subacute infarct on noncontrast head CT is present to complicate calculations.    Cerebral angio 03/20/16 1.S/P complete revascularization of occlude d LT MCA with x 2 passes with 4 mm x 40 mm Solitaire FR retriever device and 5 mg of superselective IV tpa achieving TICI 3reperfusion. 2. Progressive recanalizing Lt ACA A3 occlusion  Mr Brain Wo Contrast 03/21/2016 Acute infarct within the left lateral parietal lobe extending into the operculum, additional small foci in the left insula and left mid cingulate gyrus, and scattered punctate foci and left MCA  distribution. No evidence for hemorrhagic conversion.   Dg Chest Port 1 View 03/21/2016 IMPRESSION: Stable exam.  Low volume film with left base atelectasis.   TTE  - Left ventricle: The cavity size was normal. There was mild focal basal hypertrophy of the septum. Systolic function was normal. The estimated ejection fraction was in the range of 60% to 65%. Wall motion was normal; there were no regional wall motion abnormalities. Doppler parameters are consistent with abnormal left ventricular relaxation (grade 1 diastolic dysfunction). - Aortic valve: Mildly calcified annulus. Trileaflet; normal thickness leaflets. - Left atrium: The atrium was mildly dilated. Impressions: - No cardiac source of emboli was indentified.  LE venous doppler  There is age indeterminate DVT noted in the left femoral, popliteal, and posterior tibial veins.      HISTORY OF  PRESENT ILLNESS  This is a 66-yo man who presents via EMS. History is obtained directly from EMS initially as the patient is aphasic and unable to provide and no family was present.   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 They stated that there seemed to be some confusion about what happened as they were initially sent to the wrong floor. When they found the patient, they were told that Jermaine Burns passed out and fell, after which Jermaine Burns was noted to have weakness of the right side of his body. On their initial assessment, Jermaine Burns was noted to have a flaccid right hemiparesis with aphasia. Jermaine Burns was transported to the [facility] ED as a CODE STROKE. I met him in the ED on his arrival, at which time Jermaine Burns remained globally aphasic but was moving his right side well. NIHSS was 5. Jermaine Burns was taken for an emergent head CT that showed possible hyperdensity in the M2 branch of the L MCA. CTA of the head and neck were then obtained and confirmed  occlusion of the inferior division of the L M2 as well as occlusion of the distal L A2/proximal L A3. Initially, due to inability to confirm time last known well, Jermaine Burns was not felt to be a safe candidate for IV tPA and I contacted IR (Dr. Estanislado Pandy) to review to case with him and see if Jermaine Burns may be a suitable candidate for thrombectomy.   After this discussion, CT perfusion was obtained and revealed a large area of penumbra in the L cerebral hemisphere. While arrangements were being made for the CT perfusion, his wife arrived. She was able to contact one of the patient's coworkers who stated that she last saw him normal at 0330 this morning. Based upon this report, IV tPA was then administered with a bolus of 8.3 mg given at 0615 followed by an infusion of 75 mg. I spoke with Dr. Estanislado Pandy after the CT perfusion was completed and Jermaine Burns felt that the patient would likely benefit from thrombectomy and the IR team was mobilized. The wife was kept apprised to the treatment decisions (both tPA and IR/thrombectomy) and was agreeable to both.   His wife reports that Jermaine Burns has a history of hypercholesterolemia but is otherwise health. Jermaine Burns does not take daily aspirin. She reports that Jermaine Burns takes several supplements but is not sure what they are.   Last known well: 0330 am NIHSS score: 5 tPA given?: yes  HOSPITAL COURSE Mr. Jermaine Burns is a 65 y.o. male with history of HLD and R hip fracture presenting after a fall at work with subsequent R hemiparesis. CTA confirmed L M2 and L A2/A3 occlusion. Jermaine Burns received IV t-PA 03/20/16 at 0615 and was taken to IR where Jermaine Burns had complete revascularization of the occluded dominant inferior division of the left middle cerebral artery with 2 passes with the 4 mm x 40 mm Solitaire FR retrieval device, and 5 mg of super selective intracranial intra-arterial tPA with achievement of a TICI 3 reperfusion. Flow through the occluded left anterior cerebral artery A2 A3 segment demonstrating  angiographically improved recanalization. Following the intervention the patient was admitted to the neuro intensive care unit for close monitoring. Jermaine Burns was maintained on a ventilator and followed by the critical care service.  Stroke:  Dominant left MCA and ACA infarct s/p IV tPA and TICI 3 revasculization of L M2 and progressive recanalization of  L A3 with mechanical thrombectomy and IV tPA. Infarct felt to be embolic secondary to unknown disease source.  Resultant   aphasia and right hemiparesis  CTA head and neck L M2 occlusion. L A2/A3 occlusion. R V4 fusiform aneurysm.    CT perfusion L posterior MCA, L MCA and L ACA with salvageable penumbra. Mismatch vol 117  MRI  Left MCA and ACA territory moderate sized infarct.  Cerebral angio TICI3 left M2 recannulization  2D Echo  EF 60-65%  LE venous doppler age indeterminate DVT at LLE  TEE and loop - to be performed on an outpatient basis following consultation with cardiology.  LDL 90   HgbA1c 5.2  lovenox for VTE prophylaxis  Diet NPO time specified  No antithrombotic prior to admission, now on ASA '325mg'$  for stroke prevention.  Ongoing aggressive stroke risk factor management  Extubated 03/21/2016  Therapy recommendations:  HH PT/OT and speech therapies.  Disposition:   discharged to home. 24-hour per day supervision recommended initially.  Hyperlipidemia  Home meds:  none  LDL 90, goal < 70  Add lipitor 20  Continue statin at discharge  Other Stroke Risk Factors  Former Cigarette smoker  Occasional ETOH use  DISCHARGE EXAM Blood pressure 138/76, pulse (!) 57, temperature 97.8 F (36.6 C), temperature source Oral, resp. rate 16, height '5\' 10"'$  (1.778 m), weight 92.5 kg (203 lb 14.8 oz), SpO2 96 %.   General - Well nourished, well developed, in no apparent distress.  Ophthalmologic - Sharp disc margins OU.   Cardiovascular - Regular rate and rhythm.  Mental Status -  Level of arousal and  orientation to time, place, and person were intact. Flat affect.  Language including expression, naming, repetition, comprehension was assessed and found intact. Fund of Knowledge was assessed and was intact.  Cranial Nerves II - XII - II - Visual field intact OU. III, IV, VI - Extraocular movements intact. V - Facial sensation intact bilaterally. VII - Facial movement intact bilaterally. VIII - Hearing & vestibular intact bilaterally. X - Palate elevates symmetrically. XI - Chin turning & shoulder shrug intact bilaterally. XII - Tongue protrusion intact.  Motor Strength - The patient's strength was normal in all extremities and pronator drift was absent.  Bulk was normal and fasciculations were absent.   Motor Tone - Muscle tone was assessed at the neck and appendages and was normal.  Reflexes - The patient's reflexes were 1+ in all extremities and Jermaine Burns had no pathological reflexes.  Sensory - Light touch, temperature/pinprick were assessed and were symmetrical.    Coordination - The patient had normal movements in the hands and feet with no ataxia or dysmetria.  Tremor was absent.  Gait and Station -deferred.  Discharge Diet   Diet Heart Room service appropriate? Yes; Fluid consistency: Thin liquids  DISCHARGE PLAN  Disposition:  Discharged to home in stable and improved condition.  aspirin 325 mg daily for secondary stroke prevention.  Ongoing risk factor control by Primary Care Physician at time of discharge  Follow-up PCP in 2 weeks.  Follow-up with Dr. Rosalin Hawking at [facility] or local neurologist in 6 weeks, office to schedule an appointment.  Follow-up with cardiology for consultation with outpatient TEE and loop planned.  35 minutes were spent preparing discharge.  Rosalin Hawking, MD PhD Stroke Neurology 03/23/2016 3:15 PM

## 2016-03-23 NOTE — Progress Notes (Signed)
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  Pt also found to have L DVT of indeterminate age and MD stated no movement restrictions.  PMH includes multiple hip surgeries from childhood and one in 90's for THA.   OT comments  Pt progressing towards acute OT goals. Focus of session was education on exercises/activities for increasing proprioceptive awareness and FMC in RUE. Pt also completed grooming task and toilet transfer. Encouraged pt to continue engaging in cognitive "brain" games to facilitate improved higher level cognition including attention level. D/c plan remains appropriate.   Follow Up Recommendations  Home health OT;Supervision/Assistance - 24 hour    Equipment Recommendations  3 in 1 bedside comode    Recommendations for Other Services      Precautions / Restrictions Precautions Precautions: Fall Required Braces or Orthoses: Other Brace/Splint Other Brace/Splint: normally wears an insert in his shoe to assist with limp Restrictions Weight Bearing Restrictions: No       Mobility Bed Mobility         Supine to sit: Supervision;HOB elevated     General bed mobility comments: up in chair  Transfers Overall transfer level: Needs assistance Equipment used: Ambulation equipment used Transfers: Sit to/from Stand Sit to Stand: Min guard         General transfer comment: LOB with initial standing but able to self correct    Balance Overall balance assessment: Needs assistance Sitting-balance  support: Feet supported;No upper extremity supported Sitting balance-Leahy Scale: Good     Standing balance support: Bilateral upper extremity supported;No upper extremity supported;During functional activity Standing balance-Leahy Scale: Fair                     ADL Overall ADL's : Needs assistance/impaired     Grooming: Min guard;Standing;Oral care;Wash/dry hands Grooming Details (indicate cue type and reason): stood at sink to complete grooming tasks. Some minor difficulty noted grabbing onto smaller objects. Min guard for safety with balance. Pt using sink to stabilize.                  Toilet Transfer: Ambulation;Min guard;RW           Functional mobility during ADLs: Min guard General ADL Comments: Pt completed grooming tasks and toilet transfer as detailed above. Reviewed exercises and strategies for improved proprioception in RUE focusing on higher level strategies (ball toss using different diameter balls to increase/decrease the challenge as needed. Also encouraged pt in engaging in cognitive games such as simple sudoku, word puzzles, etc. to help improve cognition including attention level.       Vision                     Perception     Praxis      Cognition   Behavior During Therapy: [facility]FL for tasks assessed/performed;Flat affect Overall Cognitive Status: Impaired/Different from baseline Area of Impairment: Problem solving;Attention;Memory   Current Attention Level: Selective Memory: Decreased short-term memory      Awareness: Anticipatory Problem Solving: Slow processing General Comments: Mild deficits  noted in higher level language/cognitive skills.    Extremity/Trunk Assessment               Exercises Other Exercises Other Exercises: chair raises 5x, discussed FMC and proprioceptive exercises for RUE.    Shoulder Instructions       General Comments      Pertinent Vitals/ Pain       Pain Assessment: No/denies  pain  Home Living                                          Prior Functioning/Environment              Frequency  Min 2X/week        Progress Toward Goals  OT Goals(current goals can now be found in the care plan section)  Progress towards OT goals: Progressing toward goals  Acute Rehab OT Goals Patient Stated Goal: go home OT Goal Formulation: With patient/family Time For Goal Achievement: 04/05/16 Potential to Achieve Goals: Good ADL Goals Pt Will Perform Grooming: standing;sitting;with modified independence Pt Will Perform Upper Body Bathing: with modified independence;sitting Pt Will Perform Upper Body Dressing: with modified independence;sitting Pt Will Transfer to Toilet: with modified independence;ambulating Pt Will Perform Toileting - Clothing Manipulation and hygiene: with modified independence;sit to/from stand Pt Will Perform Tub/Shower Transfer: Tub transfer;with supervision;ambulating;3 in 1;rolling walker Pt/caregiver will Perform Home Exercise Program: Right Upper extremity;With theraband  Plan Discharge plan remains appropriate    Co-evaluation                 End of Session     Activity Tolerance     Patient Left     Nurse Communication          Time: 1610-9604 OT Time Calculation (min): 13 min  Charges: OT General Charges $OT Visit: 1 Procedure OT Treatments $Self Care/Home Management : 8-22 mins  Pilar Grammes 03/23/2016, 1:15 PM

## 2016-03-23 NOTE — Care Management Note (Signed)
Case Management Note  Patient Details  Name: Jermaine Burns MRN: [id number] Date of Birth: November 22, 1950  Subjective/Objective:                    Action/Plan: Pt discharging home with orders for [facility] therapies. CM met with the patient and his wife and provided them a list of [place] agencies in the [facility] area. The wife selected [facility] [place]. Santiago Glad with [facility] notified and accepted the referral. Pt also with orders for walker and 3 in 1. Brad with [facility] DME notified and will deliver the equipment to the room. Will update the bedside RN.   Expected Discharge Date:                  Expected Discharge Plan:  [place]  In-House Referral:     Discharge planning Services  CM Consult  Post Acute Care Choice:  Durable Medical Equipment, Home Health Choice offered to:  Spouse  DME Arranged:  3-N-1, Walker rolling DME Agency:  [place] Arranged:  PT, OT, Speech Therapy [place]:  [place]  Status of Service:  Completed, signed off  If discussed at [place] of Stay Meetings, dates discussed:    Additional Comments:  Pollie Friar, RN 03/23/2016, 2:24 PM

## 2016-03-23 NOTE — Discharge Instructions (Signed)
1. 24 hour per day supervision recommended until cleared by therapists. 2. Home physical, occupational, and speech therapy will be scheduled. 3. Follow up with cardiology as instructed for a trans esophogeal echocardiogram. 4. Follow up with your primary care provider in 2 - 3 weeks.  5. A rolling walker and bedside commode will be ordered.    Ischemic Stroke Treated Without Warfarin An ischemic stroke (cerebrovascular accident) is the sudden death of brain tissue. It is a medical emergency. An ischemic stroke can cause permanent loss of brain function. This can cause problems with different parts of your body. CAUSES An ischemic stroke is caused by a decrease of oxygen supply to an area of your brain. It is usually the result of a small blood clot (embolus) or collection of cholesterol or fat (plaque) that blocks blood flow in the brain. An ischemic stroke can also be caused by blocked or damaged carotid arteries. RISK FACTORS  High blood pressure (hypertension).  High cholesterol.  Diabetes mellitus.  Heart disease.  The buildup of plaque in the blood vessels (peripheral artery disease or atherosclerosis).  The buildup of plaque in the blood vessels that provide blood and oxygen to the brain (carotid artery stenosis).  An abnormal heart rhythm (atrial fibrillation).  Obesity.  Smoking cigarettes.  Taking oral contraceptives, especially in combination with using tobacco.  Physical inactivity.  A diet that is high in fats, salt (sodium), and calories.  Excessive alcohol use.  Use of illegal drugs, especially cocaine and methamphetamine.  Being African American.  Being over the age of 155 years.  Family history of stroke.  Previous history of blood clots, stroke, TIA (transient ischemic attack), or heart attack.  Sickle cell disease. SIGNS AND SYMPTOMS These symptoms usually develop suddenly, or you may notice them after waking up from sleep. Symptoms may include  sudden:  Weakness or numbness in your face, arm, or leg, especially on one side of your body.  Confusion.  Trouble speaking (aphasia) or understanding speech.  Trouble seeing with one or both eyes.  Trouble walking or difficulty moving your arms or legs.  Dizziness.  Loss of balance or coordination.  Severe headache with no known cause. The headache is often described as the worst headache ever experienced. DIAGNOSIS Your health care provider can often determine the presence or absence of an ischemic stroke based on your symptoms, history, and physical exam. CT (computed tomography) of the brain is usually performed to confirm the stroke, determine causes, and determine stroke severity. Other tests may be done to find the cause of the stroke. These tests may include:  ECG (electrocardiogram).  Continuous heart monitoring.  Echocardiogram.  Carotid ultrasound.  MRI.  A scan of the brain circulation.  Blood tests. TREATMENT It is very important to seek treatment at the first sign of stroke symptoms. Your health care provider may perform the following treatments within 6 hours of the onset of stroke symptoms:  Medicine to dissolve the blood clot (thrombolytic).  Inserting a device into the affected artery to remove the blood clot. These treatments may not be effective if too much time has passed since your stroke symptoms began. Even if you do not know when your symptoms began, get treatment as soon as possible. There are other treatment options that may be given, such as:  Oxygen.  IV fluids.  Medicines to thin the blood (anticoagulants).  A procedure to widen blocked arteries. Your treatment will depend on how long you have had your symptoms, the severity  of your symptoms, and the cause of your symptoms. Your health care provider will take measures to prevent short-term and long-term complications of stroke, such as:  Breathing foreign material into the lungs  (aspiration pneumonia).  Blood clots in the legs.  Bedsores.  Falls. Medicines and dietary changes may be used to help treat and manage risk factors for stroke, such as diabetes and high blood pressure. If any of your body's functions were impaired by stroke, you may work with physical, speech, or occupational therapists to help you recover. HOME CARE INSTRUCTIONS  Take medicines only as directed by your health care provider. Follow the directions carefully. Medicines may be used to control risk factors for a stroke. Be sure that you understand all your medicine instructions.  If swallow studies have determined that your swallowing reflex is present, you should eat healthy foods. Foods may need to be a soft or pureed consistency, or you may need to take small bites in order to avoid aspirating or choking.  Follow physical activity guidelines as directed by your health care team.  Do not use any tobacco products, including cigarettes, chewing tobacco, or electronic cigarettes. If you smoke, quit. If you need help quitting, ask your health care provider.  Limit or stop alcohol use.  A safe home environment is important to reduce the risk of falls. Your health care provider may arrange for specialists to evaluate your home. Having grab bars in the bedroom and bathroom is often important. Your health care provider may arrange for equipment to be used at home, such as raised toilets and a seat for the shower.  Ongoing physical, occupational, and speech therapy may be needed to maximize your recovery after a stroke. If you have been advised to use a walker or a cane, use it at all times. Be sure to keep your therapy appointments.  Keep all follow-up visits with your health care provider. This is very important. This includes any referrals, therapy, rehabilitation, and lab tests. Proper follow-up can prevent another stroke from occurring. PREVENTION The risk of a stroke can be decreased by  appropriately treating high blood pressure, high cholesterol, diabetes, heart disease, and obesity. It can also be decreased by quitting smoking, limiting alcohol, and staying physically active. SEEK IMMEDIATE MEDICAL CARE IF:  You have sudden weakness or numbness in your face, arm, or leg, especially on one side of your body.  You have sudden confusion.  You have sudden trouble speaking (aphasia) or understanding.  You have sudden trouble seeing with one or both eyes.  You have sudden trouble walking or difficulty moving your arms or legs.  You have sudden dizziness.  You have a sudden loss of balance or coordination.  You have a sudden, severe headache with no known cause.  You have a partial or total loss of consciousness. Any of these symptoms may represent a serious problem that is an emergency. Do not wait to see if the symptoms will go away. Get medical help right away. Call your local emergency services (911 in [place] Do not drive yourself to the hospital.   This information is not intended to replace advice given to you by your health care provider. Make sure you discuss any questions you have with your health care provider.   Document Released: 03/12/2014 Document Reviewed: 03/12/2014 Elsevier Interactive Patient Education [employer]

## 2016-03-23 NOTE — Progress Notes (Signed)
RN discussed discharge instructions with patient and wife. Neuro assessment unchanged. Pt and wife vocalized understanding of discharge instructions including medications, f/u with neurology, cardiology for tee. Patient is dressing, will notify RN when ready to be taken to car. Equipment delivered

## 2016-03-27 ENCOUNTER — Telehealth (HOSPITAL_COMMUNITY): Payer: Self-pay

## 2016-03-27 NOTE — Telephone Encounter (Signed)
Called to schedule 2 wk f/u post stroke tx, left message for pt to return call. AW

## 2016-03-29 ENCOUNTER — Other Ambulatory Visit: Payer: Self-pay

## 2016-03-29 NOTE — Patient Outreach (Signed)
[facility]) Care Management  03/29/2016  Jermaine RenderRicky L Burns 03/23/1951 [id number]   [facility] Stroke Program  Referral Date:  03/28/16 Source:  [facility] Red Alert Issue:  Issues with Meds? Yes  Been able to take every dose? No Patient is Not on APL H/o Admission 03/20/16 - 03/23/16 - Acute ischemic stroke, Hypercholesterolemia with LDL 90 / goal <70; Lipitor 20 added.  Walker, 3n1 and HH:  PT, OT, [facility] ordered at discharge.  Primary MD:  Unknown;   Follow-up in 2 weeks.  Follow-up with Dr. Marvel PlanJindong Xu [facility] or local neurologist in 6 weeks, office to schedule an appointment Follow-up with cardiology for consultation with outpatient TEE and loop planned. Wife:  [name]  Outreach call #1 to patient. Patient not reached.  Jermaine Burns CM left HIPAA compliant voice message with name and number for contact.   Jermaine Burns CM scheduled for next outreach call within one week.   Inbound call received back from wife, Jermaine LeashDonna.  Verified patient is able to complete call but [phone] is Jermaine's cell # and will need to contact patient at his cell # [phone]  Additional same day attempt to patient at his cell # [phone]   Patient not reached.  Jermaine Burns CM left HIPAA compliant voice message with name and number.  Jermaine Burns CM scheduled for next outreach call within one week.   Jermaine Burns, Jermaine Burns, BSN, Jermaine Burns, Jermaine Burns  [facility] Care Management Care Management Coordinator [phone] Direct [phone] Cell [phone] Office [phone] Fax [email or web address]

## 2016-03-30 ENCOUNTER — Other Ambulatory Visit: Payer: Self-pay

## 2016-03-30 NOTE — Patient Outreach (Signed)
[facility]) Care Management  03/30/2016  Jermaine Burns 06/24/1950 [id number]    [facility] Stroke Program  Referral Date:  03/28/16 Source:  [facility] Red Alert Issue:  Issues with Meds? Yes  Been able to take every dose? No [facility].  H/o Admission 03/20/16 - 03/23/16 - Acute ischemic stroke, Hypercholesterolemia with LDL 90 / goal <70; Lipitor 20 added.  Walker, 3n1 and HH:  PT, OT, [facility] ordered at discharge.  Primary MD:  Unknown;   Follow-up in 2 weeks.  [facility] local neurologistin 6 weeks,office to schedule an appointment Follow-up with cardiology for consultation with outpatient TEE and loop planned. Wife:  Hoover BrunetteDonna Esper  Outreach call #2 to patient. Patient not reached.  RN CM left HIPAA compliant voice message with name and number for contact.   RN CM scheduled for next outreach call within one week.   Simmie Daviesrystal Makailah Slavick, MSHL, BSN, RN, CCM  [facility] Care Management Care Management Coordinator [phone] Direct [phone] Cell [phone] Office [phone] Fax [email or web address]

## 2016-04-03 ENCOUNTER — Other Ambulatory Visit: Payer: Self-pay

## 2016-04-03 NOTE — Patient Outreach (Signed)
[facility]) Care Management  04/03/2016  Jermaine Burns 07/18/1950 [id number]   Referral Date: 03/28/16 Source: [facility] Red Alert Issue: Issues with Meds? Yes Been able to take every dose? No [facility].  H/o Admission 03/20/16 - 03/23/16 - Acute ischemic stroke, Hypercholesterolemia with LDL 90 / goal <70; Lipitor 20 added.  Walker, 3n1 and HH: PT, OT, [facility] ordered at discharge.  Primary MD: Unknown; Follow-up in 2 weeks.  [facility] with [facility] or local neurologistin 6 weeks,office to schedule an appointment Follow-up with cardiology for consultation with outpatient TEE and loop planned. Wife: [name]  Outreach call #3 to patient. Patient not reached.  RN CM left HIPAA compliant voice message with name and number for contact.  RN [facility]; unable to reach. [facility] notified).  RN CM notified Dr. Roda ShuttersXu via case closure letter.   Jermaine Burns, MSHL, BSN, RN, CCM  [facility] Care Management Care Management Coordinator [phone] Direct [phone] Cell [phone] Office [phone] Fax [email or web address]

## 2016-05-14 ENCOUNTER — Telehealth: Payer: Self-pay | Admitting: *Deleted

## 2016-05-14 NOTE — Telephone Encounter (Signed)
Pt [employer] form on Katrina.

## 2016-05-15 ENCOUNTER — Telehealth: Payer: Self-pay

## 2016-05-15 NOTE — Telephone Encounter (Signed)
Form placed on Rn desk today at 1154am.

## 2016-05-15 NOTE — Telephone Encounter (Signed)
If patient calls back please tell him I can schedule him with Dr .Lucia GaskinsAhern on  Monday at 0900am, Tuesday at 0400pm or Wednesday at 0400pm. Ask him which appt can he come to. These are hold for now. Only the RN can schedule him. It will be with Dr.Ahern unless Dr. Roda ShuttersXu has a cancellation.  LFt vm for patient to call back. Disability forms were fax to [employer]. Pt has no appts or been seen here before.

## 2016-05-17 NOTE — Telephone Encounter (Signed)
LFt vm for patient to call back to schedule appt. Pt had disability forms here for DR. Xu. Pt needs to schedule appt.

## 2016-05-23 NOTE — Telephone Encounter (Signed)
If patient calls back he needs to schedule appt with Dr. Roda ShuttersXu, Dr. Lucia GaskinsAhern or Dr.Penumalli. This is a hospital follow up. Pt needs to seek his PCP if he needs the form sooner.  RN left a third vm for patient to call back. Pt has no appts. He needs to schedule appt thanks.

## 2016-07-02 ENCOUNTER — Other Ambulatory Visit: Payer: Self-pay

## 2016-07-02 NOTE — Patient Outreach (Signed)
First telephone outreach attempt to patient to obtain mRS. No answer, left message for return call.   Nicole Florence Antonelli, B.A.  [facility] Care Management Assistant  

## 2016-07-03 ENCOUNTER — Other Ambulatory Visit: Payer: Self-pay

## 2016-07-03 NOTE — Patient Outreach (Signed)
Second telephone outreach attempt to patient to obtain mRS. No answer, left message for return call. Will try again this week.   [name], B.A.  [facility] Care Management Assistant  

## 2016-07-04 ENCOUNTER — Other Ambulatory Visit: Payer: Self-pay

## 2016-07-04 NOTE — Patient Outreach (Signed)
Third telephone outreach attempt to patient to obtain mRS. Call was answered but caller did not speak and disconnected the call. No answer upon repeat call.   3 telephone outreach attempts were completed to obtain mRS for patient. mRS could not be obtained because messages left for the patient requesting a return phone call but no return call received.  mRS = 7  Sherle PoeNicole Dustina Scoggin, Conrad BurlingtonB.A.  [facility] Care Management Assistant

## 2017-04-04 ENCOUNTER — Ambulatory Visit (INDEPENDENT_AMBULATORY_CARE_PROVIDER_SITE_OTHER): Payer: Self-pay | Admitting: Orthopaedic Surgery

## 2017-04-04 ENCOUNTER — Ambulatory Visit (INDEPENDENT_AMBULATORY_CARE_PROVIDER_SITE_OTHER): Payer: Self-pay | Admitting: Orthopedic Surgery

## 2017-04-05 ENCOUNTER — Telehealth (INDEPENDENT_AMBULATORY_CARE_PROVIDER_SITE_OTHER): Payer: Self-pay

## 2017-04-05 NOTE — Telephone Encounter (Signed)
Received call from patients wife in regards to appointment her husband had scheduled with Dr Magnus IvanBlackman on 10/25 that had to be cancelled due to emergency. Dr Magnus IvanBlackman had gotten delayed in OR and unfortunately his afternoon clinic was cancelled. Patients wife was disturbed at the way she was treated at her arrival. Apparently there was a miscommunication in relaying to them that Dr Eliberto IvoryBlackman's clinic was cancelled. I apologized repeatedly for inconvenience and relayed to them that unfortunately these things are unforseen but we were happy to help them and accomadate them in any way that we could. I even spoke with Dr Magnus IvanBlackman and he agreed to review his images that he had before re-scheduling appointment to see if he would be able to help them. However, we do not have access to images that patient has had in the past. I advised his wife if she could get them on a disc and have them sent to us we would have Dr Magnus IvanBlackman to review but also stressed to her the importance of and office visit in order to better answer any questions/concerns that they may have as well as need for additional information that we would not be able to get from scans.  I gave her my direct line to call if any questions. She was going to discuss with her husband and make a decision from there.

## 2017-10-27 IMAGING — MR MR HEAD W/O CM
9 of 10 series · 35 of 48 positions shown · non-contrast
Comparison: 03/20/2016 CT head, CT perfusion, and CT angiogram.

CLINICAL DATA: 64 y/o M; status post left ACA and MCA occlusion
post intra-arterial revascularization.

EXAM:
MRI HEAD WITHOUT CONTRAST
TECHNIQUE: Multiplanar, multiecho pulse sequences of the brain and surrounding
structures were obtained without intravenous contrast.

[Series 4: T1 · sagittal · 5.0mm · 0.47mm/px · 1 of 23 slices shown]
[im 1/23]
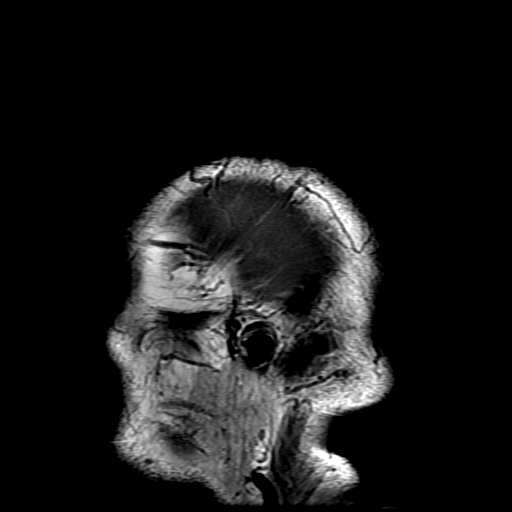

[Series 5: DWI · axial · 3.0mm · 1.09mm/px · z∈[-48,+102]mm · 8 of 102 slices shown (1 of 4)]
[im 1/102]
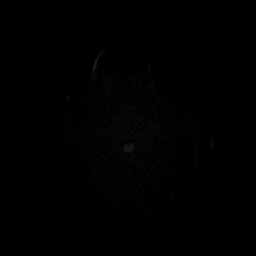
[im 12/102]
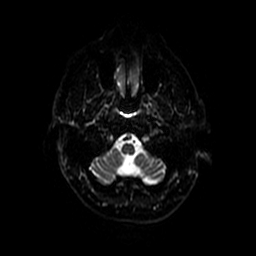
[im 34/102]
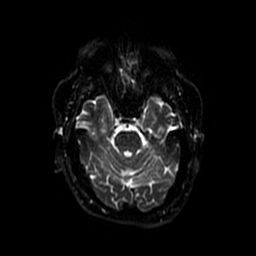
[im 45/102]
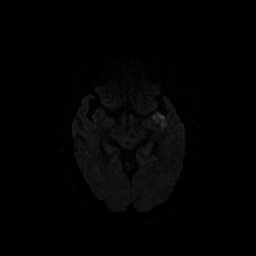
[im 57/102]
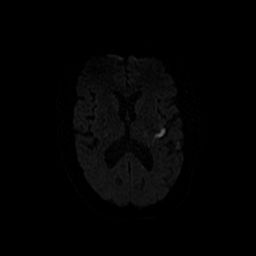
[im 68/102]
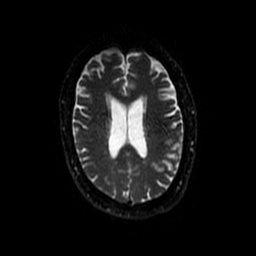
[im 90/102]
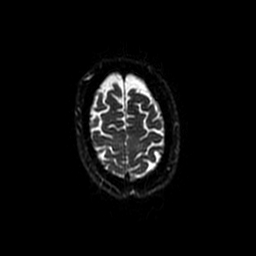
[im 102/102]
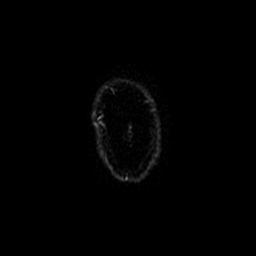

[Series 6: T2 · axial · 5.0mm · 0.43mm/px · z∈[-48,+101]mm · 3 of 26 slices shown (1 of 2)]
[im 1/26]
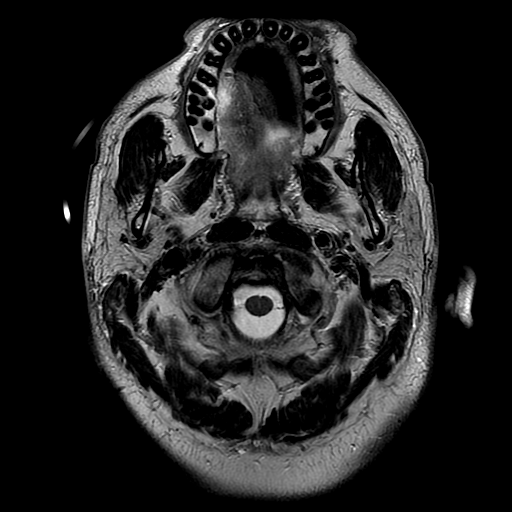
[im 13/26]
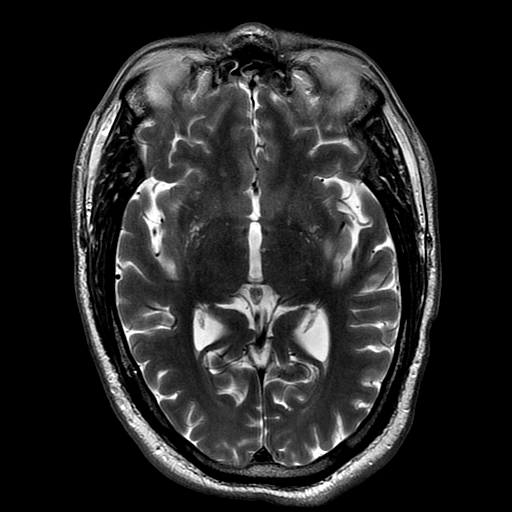
[im 26/26]
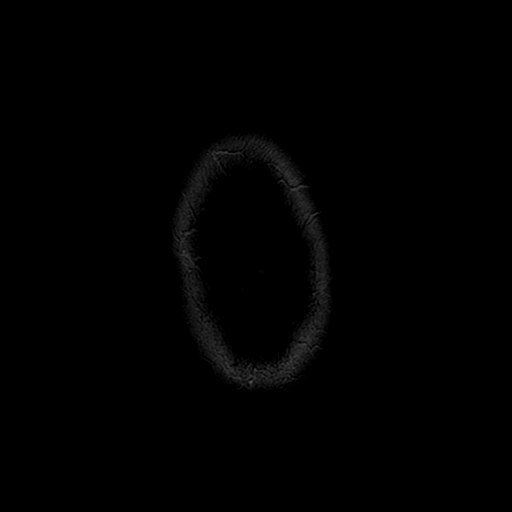

[Series 7: FLAIR · axial · 5.0mm · 0.43mm/px · z∈[-48,+101]mm · 3 of 26 slices shown]
[im 1/26]
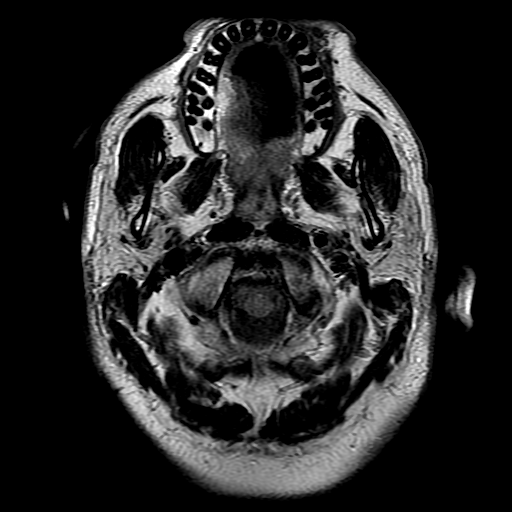
[im 13/26]
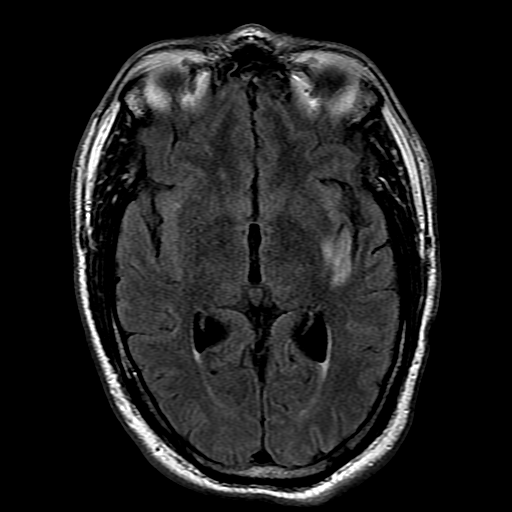
[im 26/26]
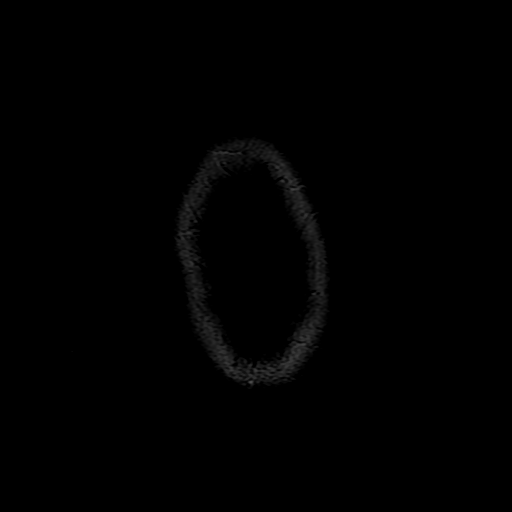

[Series 8: DWI · coronal · 5.0mm · 1.09mm/px · 7 of 70 slices shown (2 of 4)]
[im 1/70]
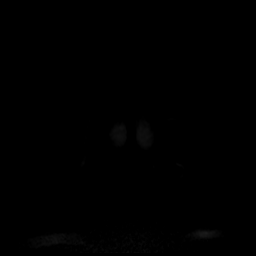
[im 12/70]
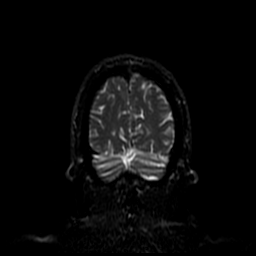
[im 24/70]
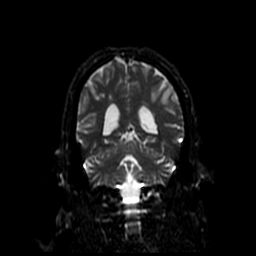
[im 35/70]
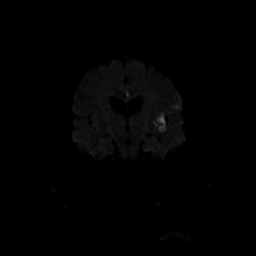
[im 47/70]
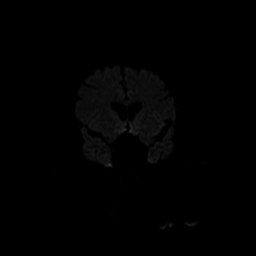
[im 58/70]
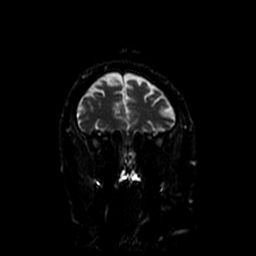
[im 70/70]
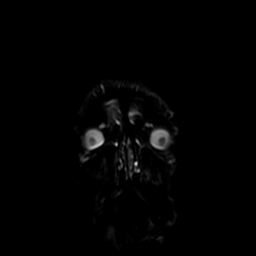

[Series 9: ax mpgr · axial · 5.0mm · 0.43mm/px · z∈[-48,+23]mm · 2 of 26 slices shown]
[im 1/26]
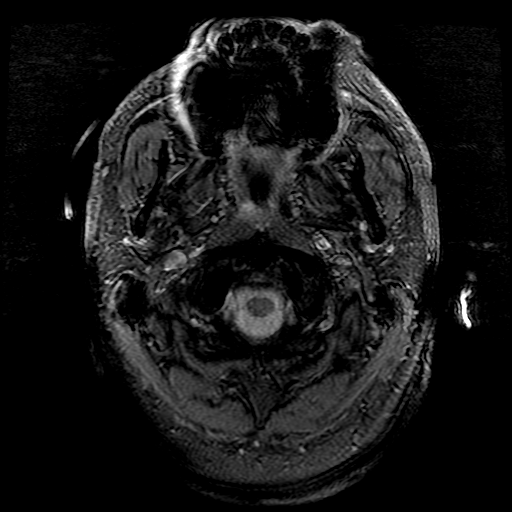
[im 13/26]
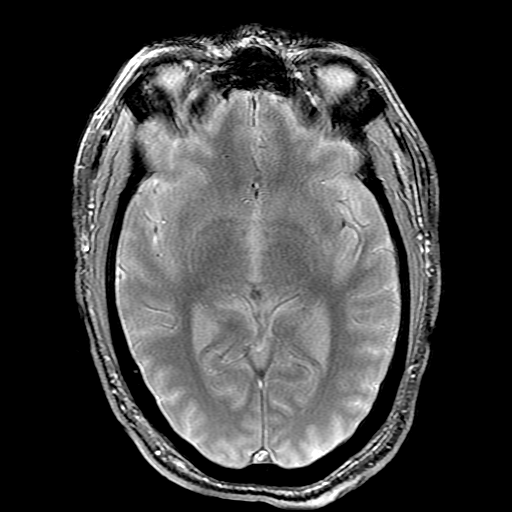

[Series 11: T2 · coronal · 5.0mm · 0.39mm/px · 3 of 27 slices shown (2 of 2)]
[im 1/27]
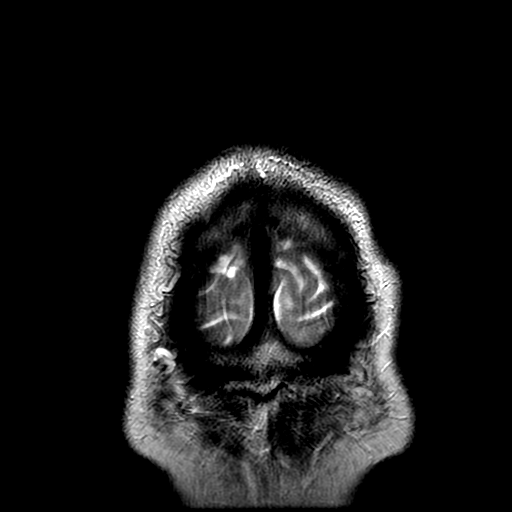
[im 14/27]
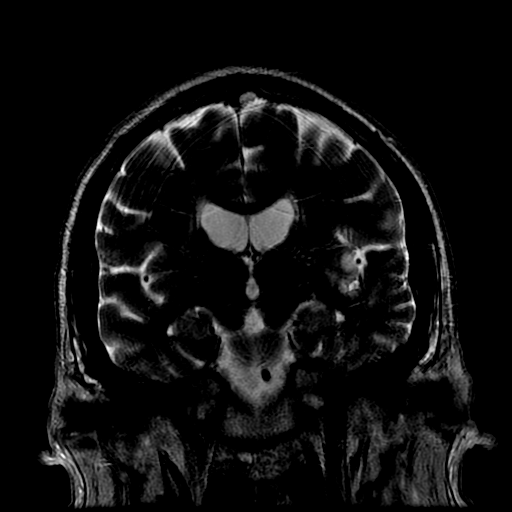
[im 27/27]
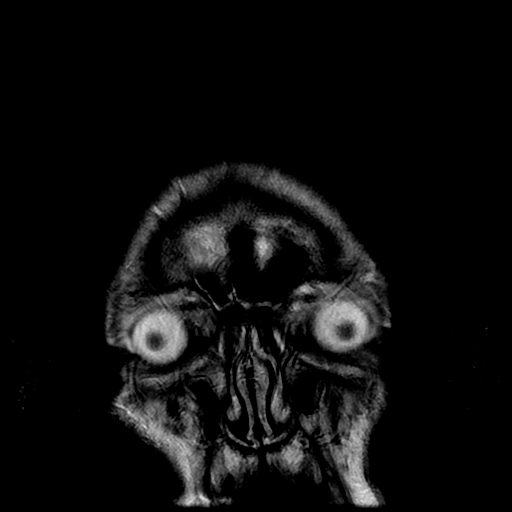

[Series 500: DWI · axial · 3.0mm · 1.09mm/px · z∈[-48,+102]mm · 5 of 51 slices shown (3 of 4)]
[im 1/51]
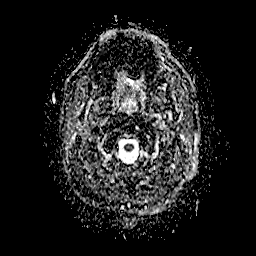
[im 13/51]
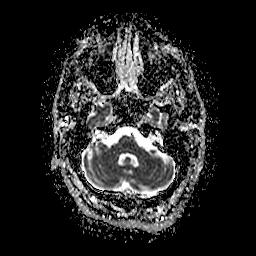
[im 26/51]
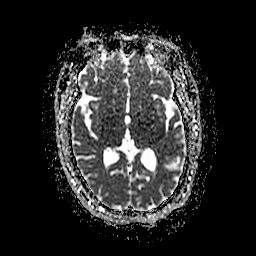
[im 38/51]
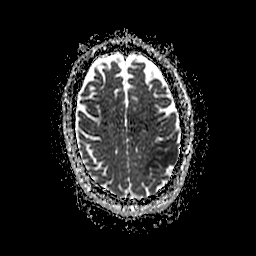
[im 51/51]
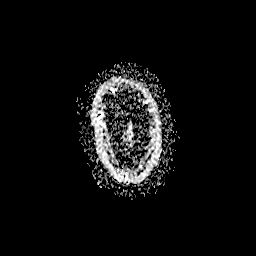

[Series 800: DWI · coronal · 5.0mm · 1.09mm/px · 3 of 35 slices shown (4 of 4)]
[im 1/35]
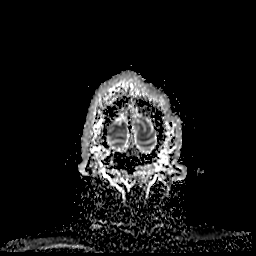
[im 18/35]
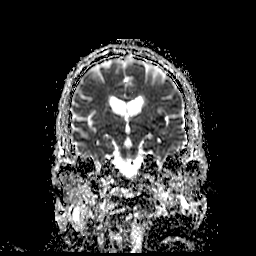
[im 35/35]
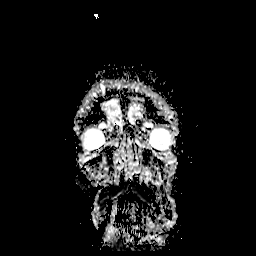

[35 of 48 positions shown; findings below may reference images not displayed]

FINDINGS: Brain: Areas of diffusion restriction within the left posterior
insula, left lateral parietal lobe extending into operculum, and a
few additional scattered punctate foci throughout the left MCA
distribution. Single small focus of diffusion restriction within the
left mid cingulate gyrus.

Areas of infarction demonstrate T2 FLAIR hyperintensity. There is an
additional background of scattered foci of T2 FLAIR hyperintensity
in subcortical and periventricular white matter compatible with mild
chronic microvascular ischemic changes. Mild parenchymal volume
loss.

No abnormal susceptibility hypointensity of the brain parenchyma to
suggest intracranial hemorrhage.

Vascular: No signal abnormality of central flow voids.

Skull and upper cervical spine: Normal marrow signal.

Sinuses/Orbits: Moderate diffuse paranasal sinus mucosal thickening
probably due to intubation. No significant abnormal signal of
mastoid air cells. Orbits are unremarkable.

Other: None.
IMPRESSION: Acute infarct within the left lateral parietal lobe extending into
the operculum, additional small foci in the left insula and left mid
cingulate gyrus, and scattered punctate foci and left MCA
distribution. No evidence for hemorrhagic conversion.

By: Richard Panchal M.D.

## 2023-04-26 NOTE — ED Provider Notes (Signed)
 Emergency Department Provider Note  This document was created using the aid of voice recognition Dragon dictation software.  History   Chief Complaint  Patient presents with  . Shortness of Breath  . Rapid Heart Rate     HPI  Jermaine Burns is a 72 y.o. male who presents to the ED with complaints of shortness of breath and elevated heart rate.  He states that his resting heart rate is only in the 60s.  He was elevated today to the 130s.  He was short of breath.  He was feeling flushed.  It happened before but it resolved on its own at that time.     Physical Exam   Vitals:   04/26/23 2147 04/26/23 2200 04/26/23 2230 04/26/23 2300  BP: 136/89 (!) 133/91 118/75 126/76  Pulse: 92 75 72 62  Resp: 18 14 15  (!) 10  Temp: 97.9 F (36.6 C)     TempSrc: Oral     SpO2: 98% 97% 95% 99%  Weight: 81.6 kg (180 lb)     Height: 180.3 cm (5' 11)       Physical Exam Constitutional Nursing notes reviewed Vital signs reviewed  HEENT No obvious trauma Supple without meningismus Pupils cross midline No scleral icterus or injection  Respiratory Effort normal CTAB No respiratory distress  CV Normal rate No pitting edema 2+ radial pulses  Abdomen Soft Non-tender Non-distended No peritonitis  MSK Atraumatic No obvious deformity ROM appropriate  Skin Warm Dry  Neuro Awake and alert Pupils cross midline Moving all extremities  Psychiatric Mood and affect normal     Procedure Note  Procedures   Medical Decision Making  Jermaine Burns presented today with palpitations. Presentation complicated by history of CVA, history of loop recorder, history of DVT, currently taking anticoagulation.     Differentials considered: Palpitations, dysrhythmia, electrolyte abnormality, anemia, dehydration   ED Workup  Given their presentation, I personally ordered labs and EKG. My interpretation is below.   Labs are reassuring, he did have a mild creatinine elevation consistent with  very mild AKI.  EKG was negative for any acute findings.  It was reassuring.  There was no signs of ischemia or arrhythmia.  All radiology and laboratory studies reviewed independently and with reading provided by radiologist unless otherwise noted.     ED Assessment/Plan  Patient here with palpitations and rapid heart rates.  He has had multiple episodes over the last day or so where his heart rate gets very elevated.  This happened to him once in the past.  He states he feels some chest tightness and shortness of breath when this happens.  He otherwise does not have any symptoms.  Does have a history of blood clots but is on blood thinners currently.  I have a low suspicion that this is causing his symptoms today.  It sounds like he is having an underlying dysrhythmia causing runs of tachycardia.  He does work nights and this could be related to dehydration especially in the setting of a mild AKI with creatinine and BUN elevations.  We discussed that he needs to increase his water intake over the next few days.  I did discuss possible admission for this but he is not interested in that at this time.  We discussed that he is going to need to have his creatinine rechecked going forward.  I did monitor his telemetry and reviewed it here in the ED.  He did not have any abnormal runs of tachycardia  while here.  We will plan for Zio patch and close outpatient cardiology follow-up.  He was comfortable with this plan.  Safe for discharge.    Patient's presentation is most consistent with acute presentation with potential threat to life or bodily function.   Emergency Department Medication Summary: Medications - No data to display  ED Clinical Impression   1. Palpitations     ED Disposition: ED Disposition     ED Disposition  Discharge   Condition  Stable   Comment  --

## 2023-10-13 NOTE — ED Provider Notes (Signed)
 Chief Complaint  Patient presents with  . Knee Pain       HPI HPI   Patient is a 73 year old male who presents today with concerns for acute onset right posterior knee pain as well as shooting pain down the right lower extremity.  He was woken up out of sleep with this pain today.  Patient's wife is at bedside, she says that a few days ago he was doing a lot of yard work, was on his knees being in the dirt we did not complain of any injury at that time.  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  No recent prolonged travel or immobilization.  No fevers.  No known trauma or injury other than doing yard work on his knees.   Patient History Past Medical History:  Diagnosis Date  . Stroke    (CMD)    Past Surgical History:  Procedure Laterality Date  . APPENDECTOMY     Procedure: APPENDECTOMY  . COLONOSCOPY     Procedure: COLONOSCOPY  . HIP SURGERY Left childhood   Procedure: HIP SURGERY; pin placed  . ORCHIECTOMY     Procedure: ORCHIECTOMY  . REVISION TOTAL HIP ARTHROPLASTY Left 06/17/2017   Procedure: TOTAL HIP REVISION;  Surgeon: Norleen Dyana Hoar, MD;  Location: [facility] MAIN OR;  Service: Orthopedics;  Laterality: Left;  tetracaine    . SHOULDER ARTHROSCOPY W/ ROTATOR CUFF REPAIR Left 08/09/2017   Procedure: SHOULDER ARTHROSCOPY W/ ROTATOR CUFF REPAIR - superior capsular reocnstruction. 70173, 70172;  Surgeon: Franky Augusta Eck, MD;  Location: [facility] OUTPATIENT OR;  Service: Orthopedics;  Laterality: Left;  . TONSILLECTOMY     Procedure: TONSILLECTOMY  . TOTAL HIP ARTHROPLASTY Left 1990   Procedure: TOTAL HIP REPLACEMENT   Family History  Problem Relation Name Age of Onset  . Stroke Mother    . Anesthesia problems Neg Hx     Social History   Tobacco Use  . Smoking status: Former    Current packs/day: 0.00     Types: Cigarettes    Quit date: 12/09/1972    Years since quitting: 50.8  . Smokeless tobacco: Never  Substance Use Topics  . Alcohol use: Yes    Comment: occasionally  . Drug use: No      Review of Systems Review of Systems   See HPI Physical Exam ED Triage Vitals [10/13/23 1636]  Temp   Heart Rate 57  Resp   BP 143/77  MAP (mmHg) 96  SpO2 98 %  O2 Device   O2 Flow Rate (L/min)   Weight    Physical Exam Vitals and nursing note reviewed.  Constitutional:      General: He is not in acute distress.    Appearance: Normal appearance.  HENT:     Head: Normocephalic.  Pulmonary:     Effort: Pulmonary effort is normal.  Musculoskeletal:     Right lower leg: No edema.     Left lower leg: No edema.     Comments: Pain to the posterior right knee. No swelling or redness of the knee. No anterior tenderness. Calf tenderness. Pain in the calf with extension for the right foot.   Skin:    General: Skin is warm and dry.     Capillary Refill: Capillary refill takes less than 2 seconds.  Neurological:     General: No focal deficit present.     Mental Status: He is alert.  Psychiatric:        Mood and Affect: Mood normal.        CHA2DS2-VASc Score: N/A  Glasgow Coma Scale Score: 15                 Procedures                       ED Course & MDM   Medical Decision Making Problems Addressed: Pain of right lower extremity: complicated acute illness or injury  Amount and/or Complexity of Data Reviewed Radiology: ordered.  Risk Prescription drug management.   Clinical Complexity Patient's presentation is most consistent with acute presentation with potential threat to life or bodily function.   Patient does have comorbidities/underlying diseases, namely on anticoagulation, which makes the patient's presentation today of calf and knee pain higher risk, increases risk of complications and morbidity/mortality of patient management, and ultimately increases  complexity of their visit today.  Medical records reviewed independently by me, this is notable for imaging.  DDx: DVT, abscess, knee effusion, neuropathy, among others  Initially patient declined need for pain medication, later he is requesting something.  Given a dose of fentanyl  with improvement.  Will obtain imaging of the right lower leg.  x-ray of the knee with no acute fracture, no malalignment, no joint effusion, mild tricompartmental degenerative changes.  Peripheral ultrasound no evidence of DVT in the right lower extremity.  Patient describes his pain as intermittent and shooting in nature which sounds more like nerve type pain.  Patient did have a few days where he was doing yard work while on his knees, potentially could have injured nerve in the right lower extremity.  Will discharge patient with a prescription for prednisone as well as Celebrex.  He will continue his Xarelto at this time.  No indication for hospital admission.  Return precautions discussed.  Patient discharged from the emergency department stable condition.   Multiple problems were addressed during today's visit as outlined above.     ED Disposition:  Discharge Final diagnoses:  Pain of right lower extremity    ED Prescriptions     Medication Sig Dispense Start Date End Date Auth. Provider   predniSONE (DELTASONE) 20 mg tablet Take 2 tablets (40 mg total) by mouth daily for 4 days. 8 tablet 10/13/2023 10/17/2023 Megan Reklis Caudle, NP   celecoxib (CeleBREX) 200 mg capsule Take 1 capsule (200 mg total) by mouth 2 (two) times a day as needed for moderate pain (4-6). 10 capsule 10/13/2023 10/18/2023 Duwaine Boyce Stark, NP

## 2024-02-02 ENCOUNTER — Emergency Department (HOSPITAL_COMMUNITY)
Admission: EM | Admit: 2024-02-02 | Discharge: 2024-02-03 | Disposition: A | Discharge status: Home / Self Care | Attending: Emergency Medicine | Admitting: Emergency Medicine

## 2024-02-02 ENCOUNTER — Encounter (HOSPITAL_COMMUNITY): Payer: Self-pay | Admitting: Emergency Medicine

## 2024-02-02 ENCOUNTER — Emergency Department (HOSPITAL_COMMUNITY)

## 2024-02-02 ENCOUNTER — Other Ambulatory Visit: Payer: Self-pay

## 2024-02-02 DIAGNOSIS — R55 Syncope and collapse: Secondary | ICD-10-CM | POA: Diagnosis not present

## 2024-02-02 DIAGNOSIS — Z87891 Personal history of nicotine dependence: Secondary | ICD-10-CM | POA: Insufficient documentation

## 2024-02-02 DIAGNOSIS — Z8673 Personal history of transient ischemic attack (TIA), and cerebral infarction without residual deficits: Secondary | ICD-10-CM | POA: Diagnosis not present

## 2024-02-02 DIAGNOSIS — Z7901 Long term (current) use of anticoagulants: Secondary | ICD-10-CM | POA: Insufficient documentation

## 2024-02-02 DIAGNOSIS — R42 Dizziness and giddiness: Secondary | ICD-10-CM | POA: Diagnosis present

## 2024-02-02 LAB — CBC
HCT: 44.4 % (ref 39.0–52.0)
Hemoglobin: 14.4 g/dL (ref 13.0–17.0)
MCH: 30.5 pg (ref 26.0–34.0)
MCHC: 32.4 g/dL (ref 30.0–36.0)
MCV: 94.1 fL (ref 80.0–100.0)
Platelets: 197 K/uL (ref 150–400)
RBC: 4.72 MIL/uL (ref 4.22–5.81)
RDW: 12.9 % (ref 11.5–15.5)
WBC: 5.9 K/uL (ref 4.0–10.5)
nRBC: 0 % (ref 0.0–0.2)

## 2024-02-02 LAB — BASIC METABOLIC PANEL WITH GFR
Anion gap: 9 (ref 5–15)
BUN: 32 mg/dL — ABNORMAL HIGH (ref 8–23)
CO2: 23 mmol/L (ref 22–32)
Calcium: 9.4 mg/dL (ref 8.9–10.3)
Chloride: 106 mmol/L (ref 98–111)
Creatinine, Ser: 1.05 mg/dL (ref 0.61–1.24)
GFR, Estimated: >60 mL/min (ref >60)
Glucose, Bld: 97 mg/dL (ref 70–99)
Potassium: 4.2 mmol/L (ref 3.5–5.1)
Sodium: 138 mmol/L (ref 135–145)

## 2024-02-02 LAB — TROPONIN I (HIGH SENSITIVITY): Troponin I (High Sensitivity): 5 ng/L (ref <18)

## 2024-02-02 NOTE — ED Notes (Signed)
 Blue top sent down to lab.

## 2024-02-02 NOTE — ED Provider Notes (Signed)
  [place] [facility]GENCY DEPARTMENT AT [facility] Provider Note   CSN: [id number] Arrival date & time: 02/02/24  2111     History Chief Complaint  Patient presents with   Dizziness   Back Pain    HPI Jermaine Burns is a 73 y.o. male presenting for chief complaint of sudden onset numbness and tingling. 830 PM sudden onset inability to use his legs and a lightheadedness.  Hx of CVA 7 years ago and still on Xarelto.  Patient's recorded medical, surgical, social, medication list and allergies were reviewed in the Snapshot window as part of the initial history.   Review of Systems   Review of Systems  Physical Exam Updated Vital Signs BP (!) 147/82   Pulse 66   Temp 97.8 F (36.6 C) (Oral)   Resp 20   Wt 92.5 kg   SpO2 100%   BMI 29.26 kg/m  Physical Exam   ED Course/ Medical Decision Making/ A&P    Procedures Procedures   Medications Ordered in ED Medications - No data to display  Medical Decision Making:   Jermaine Burns is a 73 y.o. male who presented to the ED today with *** detailed above.    {crccomplexity:27900} Complete initial physical exam performed, notably the patient  was ***.    Reviewed and confirmed nursing documentation for past medical history, family history, social history.    Initial Assessment:   With the patient's presentation of ***, most likely diagnosis is ***. Other diagnoses were considered including (but not limited to) ***. These are considered less likely due to history of present illness and physical exam findings.   {crccopa:27899}  Initial Plan:  ***  ***Screening labs including CBC and Metabolic panel to evaluate for infectious or metabolic etiology of disease.  ***Urinalysis with reflex culture ordered to evaluate for UTI or relevant urologic/nephrologic pathology.  ***CXR to evaluate for structural/infectious intrathoracic pathology.  {crccardiactesting:32591::EKG to evaluate for cardiac pathology} Objective  evaluation as below reviewed   Initial Study Results:   Laboratory  All laboratory results reviewed without evidence of clinically relevant pathology.   ***Exceptions include: ***   ***EKG EKG was reviewed independently. Rate, rhythm, axis, intervals all examined and without medically relevant abnormality. ST segments without concerns for elevations.    Radiology:  All images reviewed independently. ***Agree with radiology report at this time.   DG Chest 2 View Result Date: 02/02/2024 CLINICAL DATA:  Dizziness and hypertension EXAM: CHEST - 2 VIEW COMPARISON:  03/21/2016 FINDINGS: Cardiac shadow is within normal limits. Loop recorder is seen. Lungs are well aerated bilaterally. No focal infiltrate or effusion is seen. No bony abnormality is noted. IMPRESSION: No acute abnormality noted. Electronically Signed   By: Oneil Devonshire M.D.   On: 02/02/2024 21:50      Consults: Case discussed with ***.   Reassessment and Plan:   ***    ***  Clinical Impression: No diagnosis found.   Data Unavailable   Final Clinical Impression(s) / ED Diagnoses Final diagnoses:  None    Rx / DC Orders ED Discharge Orders     None

## 2024-02-02 NOTE — ED Triage Notes (Signed)
 Pt in with from work via [employer] with sudden onset of some dizziness, low back pain and bilateral leg numbness. 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 Nonradiating low back pain then began. No cp or sob reported. Takes Xarelto daily, no missed doses. Former CVA hx  VS w/EMS: 153/70 HR 74 100%RA CBG 92

## 2024-02-03 ENCOUNTER — Emergency Department (HOSPITAL_COMMUNITY)

## 2024-02-03 DIAGNOSIS — R55 Syncope and collapse: Secondary | ICD-10-CM

## 2024-02-03 DIAGNOSIS — Z7901 Long term (current) use of anticoagulants: Secondary | ICD-10-CM

## 2024-02-03 LAB — TROPONIN I (HIGH SENSITIVITY): Troponin I (High Sensitivity): 5 ng/L (ref <18)

## 2024-02-03 MED ORDER — IOHEXOL 350 MG/ML SOLN
85.0000 mL | Freq: Once | INTRAVENOUS | Status: AC | PRN
  Administered 2024-02-03: 85 mL via INTRAVENOUS

## 2024-02-03 NOTE — Consult Note (Signed)
 NEUROLOGY CONSULT NOTE   Date of service: February 03, 2024 Patient Name: Jermaine Burns MRN:  [id number] DOB:  [birth year] Chief Complaint: Dizziness Requesting Provider: Jerral Meth, MD  History of Present Illness  Jermaine Burns is a 73 y.o. 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 He was in a patient's room when he started to feel warm and clammy, then lightheaded and weak as if his legs were giving out. By the time he arrived to [facility], he still felt wobbly. MRI brain w/o contrast showed known prior stroke. Currently back to baseline.  As per chart review, does have history of presenting for shortness of breath and feeling flushed in 04/2023. At that time it was reported that this had happened before.  Denies any recent changes in medication or sick symptoms.  Denies any focal neuro deficits.  Patient's prior stroke occurred 03/2016. While at work, he fell due to sudden right-sided weakness and was also found to be aphasic. CTA showed occlusion of the L M2 (inferior division), distal L A2, proximal L A3. He received tPA and thrombectomy of the L M2. He was placed on aspirin . Loop recorder and hypercoagulable work-up was unrevealing. Incidentally discovered DVT in 2017 during stroke work-up and was placed on anticoagulation, which was stopped 08/2018. However, in 01/2019, developed bilateral pulmonary emboli, for which he is indefinitely on Xarelto.   ROS  Comprehensive ROS performed and pertinent positives documented in HPI   Past History   Past Medical History:  Diagnosis Date   Hypercholesterolemia     Past Surgical History:  Procedure Laterality Date   IR GENERIC HISTORICAL  03/20/2016   IR ANGIO VERTEBRAL SEL SUBCLAVIAN INNOMINATE BILAT MOD SED 03/20/2016 Thyra Nash, MD [facility]INTER[facility] RAD   IR  GENERIC HISTORICAL  03/20/2016   IR PERCUTANEOUS ART THROMBECTOMY/INFUSION INTRACRANIAL INC DIAG ANGIO 03/20/2016 Thyra Nash, MD [facility]   IR GENERIC HISTORICAL  03/20/2016   IR ANGIO INTRA EXTRACRAN SEL COM CAROTID INNOMINATE UNI L MOD SED 03/20/2016 Thyra Nash, MD [facility]   JOINT REPLACEMENT     left hip   RADIOLOGY WITH ANESTHESIA N/A 03/20/2016   Procedure: RADIOLOGY WITH ANESTHESIA;  Surgeon: Thyra Nash, MD;  Location: [facility] OR;  Service: Radiology;  Laterality: N/A;    Family History: History reviewed. No pertinent family history.  Social History  reports that he has quit smoking. His smoking use included cigarettes. He has never used smokeless tobacco. He reports current alcohol use. He reports that he does not use drugs.  No Known Allergies  Medications  No current facility-administered medications for this encounter.  Current Outpatient Medications:    acetaminophen  (TYLENOL ) 325 MG tablet, Take 2 tablets (650 mg total) by mouth every 6 (six) hours as needed for mild pain (temp >/= 99.5)., Disp: , Rfl:    aspirin  EC 325 MG EC tablet, Take 1 tablet (325 mg total) by mouth daily., Disp: , Rfl: 0   atorvastatin  (LIPITOR) 20 MG tablet, Take 1 tablet (20 mg total) by mouth daily at 6 PM., Disp: 30 tablet, Rfl: 5   Multiple Vitamin (MULTIVITAMIN WITH MINERALS) TABS tablet, Take 1 tablet by mouth daily., Disp: , Rfl:   Vitals   Vitals:   02/02/24 2334 02/03/24 0030 02/03/24 0100 02/03/24 0130  BP:  (!) 147/83 139/84 (!) 147/89  Pulse:  ROLLEN)  58 (!) 52 (!) 52  Resp:  18 15 16   Temp:      TempSrc:      SpO2: 100% 100% 99% 99%  Weight:        Body mass index is 29.26 kg/m.   Physical Exam   Constitutional: Appears well-developed and well-nourished.  Psych: Flat affect. Eyes: No scleral injection.  HENT: No OP obstruction.  Head: Normocephalic.  Cardiovascular: Sinus brady (reports baseline is low) Respiratory: Effort normal, non-labored  breathing.  GI: Soft.  No distension.  Skin: WDI.   Neurologic Examination   Mental status: alert, oriented to person, place and time. Able to provide history. Speech: no dysarthria, word-finding difficulty, paraphasic errors. Cranial nerves: PERRL EOMI VF full Face sensation intact bilaterally. Face symmetric at rest and with activation. Hearing grossly intact. Palate elevation symmetric Tongue protrudes midline and has full range of motion. SCM's full strength bilaterally. Motor: Normal bulk and tone. No abnormal movements RUE: shoulder abduction 5/5, biceps 5/5, triceps 5/5, wrist flexion 5/5, wrist extension 5/5, hand grip 5/5 LUE: shoulder abduction 5/5, biceps 5/5, triceps 5/5, wrist flexion 5/5, wrist extension 5/5, hand grip 5/5 RLE: hip flexion 5/5, knee flexion 5/5, knee extension 5/5, ankle dorsiflexion 5/5, plantar flexion 5/5 LLE: hip flexion 5/5, knee flexion 5/5, knee extension 5/5, ankle dorsiflexion 5/5, plantar flexion 5/5 Sensory: Grossly intact to light touch throughout. Reflexes: DTR's deferred. Downgoing toes bilaterally. Coordination: FTN intact. HTS intact. Gait: deferred   Labs/Imaging/Neurodiagnostic studies   CBC:  Recent Labs  Lab Feb 14, 2024 2122  WBC 5.9  HGB 14.4  HCT 44.4  MCV 94.1  PLT 197   Basic Metabolic Panel:  Lab Results  Component Value Date   NA 138 02-14-24   K 4.2 February 14, 2024   CO2 23 Feb 14, 2024   GLUCOSE 97 02-14-24   BUN 32 (H) Feb 14, 2024   CREATININE 1.05 Feb 14, 2024   CALCIUM  9.4 02/14/24   GFRNONAA >60 02/14/24   GFRAA >60 03/23/2016   Lipid Panel:  Lab Results  Component Value Date   LDLCALC 90 03/21/2016   HgbA1c:  Lab Results  Component Value Date   HGBA1C 5.2 03/21/2016   Urine Drug Screen:     Component Value Date/Time   LABOPIA NONE DETECTED 03/20/2016 0501   COCAINSCRNUR NONE DETECTED 03/20/2016 0501   LABBENZ NONE DETECTED 03/20/2016 0501   AMPHETMU NONE DETECTED 03/20/2016 0501    THCU NONE DETECTED 03/20/2016 0501   LABBARB NONE DETECTED 03/20/2016 0501    Alcohol Level     Component Value Date/Time   ETH <5 03/20/2016 0501   INR  Lab Results  Component Value Date   INR 1.01 03/20/2016   APTT  Lab Results  Component Value Date   APTT 27 03/20/2016   AED levels: No results found for: PHENYTOIN, ZONISAMIDE, LAMOTRIGINE, LEVETIRACETA  MRI Brain(Personally reviewed): No acute abnormality. Encephalomalacia of L frontoparietal. Small vessel disease.     ASSESSMENT   Jermaine Burns is a 73 y.o. 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 Description of prodrome of flushing and feeling clammy, followed by sensation of lightheadedness consistent with syncope rather than TIA.   RECOMMENDATIONS  - Medical work-up as per primary team - Can see outpatient if needed for work-up of neurogenic causes of syncope ______________________________________________________________________    Signed, Normie CHRISTELLA Blower, MD [facility]

## 2024-02-03 NOTE — ED Notes (Signed)
 Back from CT
